# Patient Record
Sex: Female | Born: 1955 | Race: White | Hispanic: No | Smoking: Former smoker
Health system: Southern US, Community
[De-identification: ages and names within clinical notes are randomized; demographics above are authoritative.]

## PROBLEM LIST (undated history)

## (undated) DIAGNOSIS — I671 Cerebral aneurysm, nonruptured: Secondary | ICD-10-CM

## (undated) DIAGNOSIS — Z9849 Cataract extraction status, unspecified eye: Secondary | ICD-10-CM

## (undated) DIAGNOSIS — D649 Anemia, unspecified: Secondary | ICD-10-CM

## (undated) DIAGNOSIS — E039 Hypothyroidism, unspecified: Secondary | ICD-10-CM

## (undated) DIAGNOSIS — J45909 Unspecified asthma, uncomplicated: Secondary | ICD-10-CM

## (undated) DIAGNOSIS — M1990 Unspecified osteoarthritis, unspecified site: Secondary | ICD-10-CM

## (undated) HISTORY — PX: HEEL SPUR SURGERY: SHX665

## (undated) HISTORY — DX: Unspecified osteoarthritis, unspecified site: M19.90

## (undated) HISTORY — PX: CATARACT EXTRACTION: SUR2

## (undated) HISTORY — DX: Cerebral aneurysm, nonruptured: I67.1

## (undated) HISTORY — DX: Unspecified asthma, uncomplicated: J45.909

## (undated) HISTORY — DX: Hypothyroidism, unspecified: E03.9

## (undated) HISTORY — DX: Anemia, unspecified: D64.9

---

## 2014-10-13 ENCOUNTER — Ambulatory Visit
Admission: RE | Admit: 2014-10-13 | Discharge: 2014-10-13 | Disposition: A | Discharge status: Home / Self Care | Payer: Self-pay | Source: Ambulatory Visit

## 2014-10-13 ENCOUNTER — Ambulatory Visit: Payer: BLUE CROSS/BLUE SHIELD | Admitting: Body Imaging

## 2014-10-13 ENCOUNTER — Encounter: Payer: Self-pay | Admitting: Diagnostic Radiology

## 2014-10-13 ENCOUNTER — Encounter: Payer: Self-pay | Admitting: Body Imaging

## 2014-10-13 ENCOUNTER — Other Ambulatory Visit: Payer: Self-pay

## 2014-10-15 ENCOUNTER — Other Ambulatory Visit: Payer: Self-pay | Admitting: Body Imaging

## 2014-10-15 DIAGNOSIS — I671 Cerebral aneurysm, nonruptured: Secondary | ICD-10-CM

## 2014-10-16 ENCOUNTER — Other Ambulatory Visit: Payer: Self-pay

## 2014-10-16 ENCOUNTER — Inpatient Hospital Stay
Admission: RE | Admit: 2014-10-16 | Discharge: 2014-10-16 | Disposition: A | Discharge status: Home / Self Care | Payer: Self-pay | Source: Ambulatory Visit

## 2014-10-16 ENCOUNTER — Telehealth: Payer: Self-pay | Admitting: Body Imaging

## 2014-10-16 NOTE — Telephone Encounter (Signed)
Spoke with pt after Dr.Putman reviewed the images and told her that he felt like the PIPELINE flow diverter would be the best course of action.  Pt is aware that we need to contact the company and have them sign off on the images as well as coordinate a date and time if approved to do the PIPELINE.  After discussion with Dr Aleen Sells Plavix 75 mg po daily was called into the [facility] pharmacy in [place] [phone] at the pts request. Pt instructed to take this and aspirin 325mg  po daily in the am in prep for PIPELINE.

## 2014-10-19 ENCOUNTER — Encounter: Admission: RE | Payer: Self-pay | Source: Ambulatory Visit

## 2014-10-19 ENCOUNTER — Inpatient Hospital Stay: Admission: RE | Admit: 2014-10-19 | Payer: BLUE CROSS/BLUE SHIELD | Source: Ambulatory Visit | Admitting: Body Imaging

## 2014-10-19 SURGERY — NEURO EMBOLIZATION
Anesthesia: General

## 2014-10-26 ENCOUNTER — Inpatient Hospital Stay: Payer: BLUE CROSS/BLUE SHIELD | Admitting: Anesthesiology

## 2014-10-26 ENCOUNTER — Encounter
Admission: RE | Disposition: A | Discharge status: Home / Self Care | Payer: Self-pay | Source: Ambulatory Visit | Attending: Body Imaging

## 2014-10-26 ENCOUNTER — Inpatient Hospital Stay: Payer: BLUE CROSS/BLUE SHIELD | Admitting: Body Imaging

## 2014-10-26 ENCOUNTER — Inpatient Hospital Stay
Admission: RE | Admit: 2014-10-26 | Discharge: 2014-10-27 | DRG: 027 | Disposition: A | Discharge status: Home / Self Care | Payer: BLUE CROSS/BLUE SHIELD | Source: Ambulatory Visit | Attending: Body Imaging | Admitting: Body Imaging

## 2014-10-26 DIAGNOSIS — J45998 Other asthma: Secondary | ICD-10-CM | POA: Diagnosis present

## 2014-10-26 DIAGNOSIS — E039 Hypothyroidism, unspecified: Secondary | ICD-10-CM | POA: Diagnosis present

## 2014-10-26 DIAGNOSIS — I671 Cerebral aneurysm, nonruptured: Principal | ICD-10-CM | POA: Diagnosis present

## 2014-10-26 DIAGNOSIS — D649 Anemia, unspecified: Secondary | ICD-10-CM | POA: Diagnosis present

## 2014-10-26 DIAGNOSIS — Z87891 Personal history of nicotine dependence: Secondary | ICD-10-CM

## 2014-10-26 HISTORY — DX: Cataract extraction status, unspecified eye: Z98.49

## 2014-10-26 HISTORY — DX: Cerebral aneurysm, nonruptured: I67.1

## 2014-10-26 SURGERY — NEURO EMBOLIZATION
Anesthesia: Anesthesia General | Laterality: Right

## 2014-10-26 MED ORDER — GLYCOPYRROLATE 0.2 MG/ML IJ SOLN
INTRAMUSCULAR | Status: AC
Start: 2014-10-26 — End: ?
  Filled 2014-10-26: qty 1

## 2014-10-26 MED ORDER — HEPARIN (PORCINE) IN NACL 2-0.9 UNIT/ML-% IJ SOLN
INTRAMUSCULAR | Status: AC
Start: 2014-10-26 — End: ?
  Filled 2014-10-26: qty 5000

## 2014-10-26 MED ORDER — HYDROMORPHONE HCL 1 MG/ML IJ SOLN
INTRAMUSCULAR | Status: AC
Start: 2014-10-26 — End: 2014-10-26
  Administered 2014-10-26: 0.4 mg via INTRAVENOUS
  Filled 2014-10-26: qty 1

## 2014-10-26 MED ORDER — ONDANSETRON HCL 4 MG/2ML IJ SOLN
INTRAMUSCULAR | Status: AC
Start: 2014-10-26 — End: ?
  Filled 2014-10-26: qty 2

## 2014-10-26 MED ORDER — PROPOFOL INFUSION 10 MG/ML
INTRAVENOUS | Status: DC | PRN
Start: 2014-10-26 — End: 2014-10-26
  Administered 2014-10-26: 50 mg via INTRAVENOUS
  Administered 2014-10-26: 150 mg via INTRAVENOUS

## 2014-10-26 MED ORDER — PROPOFOL 10 MG/ML IV EMUL (WRAP)
INTRAVENOUS | Status: AC
Start: 2014-10-26 — End: ?
  Filled 2014-10-26: qty 20

## 2014-10-26 MED ORDER — EPHEDRINE SULFATE 50 MG/ML IJ SOLN
INTRAMUSCULAR | Status: AC
Start: 2014-10-26 — End: ?
  Filled 2014-10-26: qty 1

## 2014-10-26 MED ORDER — FAMOTIDINE 20 MG PO TABS
20.0000 mg | ORAL_TABLET | Freq: Every day | ORAL | Status: DC
Start: 2014-10-26 — End: 2014-10-27
  Administered 2014-10-27: 20 mg via ORAL
  Filled 2014-10-26: qty 1

## 2014-10-26 MED ORDER — LIDOCAINE HCL 2 % IJ SOLN
INTRAMUSCULAR | Status: DC | PRN
Start: 2014-10-26 — End: 2014-10-26
  Administered 2014-10-26: 80 mg

## 2014-10-26 MED ORDER — ESMOLOL HCL 10 MG/ML IV SOLN
INTRAVENOUS | Status: DC | PRN
Start: 2014-10-26 — End: 2014-10-26
  Administered 2014-10-26: 30 mg via INTRAVENOUS

## 2014-10-26 MED ORDER — LABETALOL HCL 5 MG/ML IV SOLN
20.0000 mg | Freq: Once | INTRAVENOUS | Status: DC | PRN
Start: 2014-10-26 — End: 2014-10-26

## 2014-10-26 MED ORDER — LEVOTHYROXINE SODIUM 88 MCG PO TABS
88.0000 ug | ORAL_TABLET | Freq: Every day | ORAL | Status: DC
Start: 2014-10-26 — End: 2014-10-27
  Administered 2014-10-27: 88 ug via ORAL
  Filled 2014-10-26 (×2): qty 1

## 2014-10-26 MED ORDER — SODIUM CHLORIDE 0.9 % IV SOLN
INTRAVENOUS | Status: DC | PRN
Start: 2014-10-26 — End: 2014-10-26

## 2014-10-26 MED ORDER — EPHEDRINE SULFATE 50 MG/ML IJ SOLN
INTRAMUSCULAR | Status: DC | PRN
Start: 2014-10-26 — End: 2014-10-26
  Administered 2014-10-26: 35 mg via INTRAMUSCULAR
  Administered 2014-10-26: 10 mg via INTRAVENOUS

## 2014-10-26 MED ORDER — HEPARIN (PORCINE) IN NACL 2-0.9 UNIT/ML-% IJ SOLN
INTRAMUSCULAR | Status: AC
Start: 2014-10-26 — End: ?
  Filled 2014-10-26: qty 1000

## 2014-10-26 MED ORDER — ONDANSETRON HCL 4 MG/2ML IJ SOLN
INTRAMUSCULAR | Status: DC | PRN
Start: 2014-10-26 — End: 2014-10-26
  Administered 2014-10-26: 4 mg via INTRAVENOUS

## 2014-10-26 MED ORDER — HEPARIN SODIUM (PORCINE) 1000 UNIT/ML IJ SOLN
INTRAMUSCULAR | Status: AC
Start: 2014-10-26 — End: ?
  Filled 2014-10-26: qty 10

## 2014-10-26 MED ORDER — OXYCODONE-ACETAMINOPHEN 5-325 MG PO TABS
1.0000 | ORAL_TABLET | Freq: Once | ORAL | Status: DC | PRN
Start: 2014-10-26 — End: 2014-10-26

## 2014-10-26 MED ORDER — DEXAMETHASONE SODIUM PHOSPHATE 4 MG/ML IJ SOLN (WRAP)
INTRAMUSCULAR | Status: DC | PRN
Start: 2014-10-26 — End: 2014-10-26
  Administered 2014-10-26: 10 mg via INTRAVENOUS

## 2014-10-26 MED ORDER — LABETALOL HCL 5 MG/ML IV SOLN
10.0000 mg | INTRAVENOUS | Status: DC | PRN
Start: 2014-10-26 — End: 2014-10-27

## 2014-10-26 MED ORDER — VERAPAMIL HCL 2.5 MG/ML IV SOLN
INTRAVENOUS | Status: AC | PRN
Start: 2014-10-26 — End: 2014-10-26
  Administered 2014-10-26: 10 mg via INTRAVENOUS

## 2014-10-26 MED ORDER — FENTANYL CITRATE (PF) 50 MCG/ML IJ SOLN (WRAP)
25.0000 ug | INTRAMUSCULAR | Status: AC | PRN
Start: 2014-10-26 — End: 2014-10-26
  Administered 2014-10-26 (×4): 25 ug via INTRAVENOUS

## 2014-10-26 MED ORDER — HYDROMORPHONE HCL 2 MG PO TABS
2.0000 mg | ORAL_TABLET | ORAL | Status: DC | PRN
Start: 2014-10-26 — End: 2014-10-27
  Administered 2014-10-26 – 2014-10-27 (×4): 2 mg via ORAL
  Filled 2014-10-26 (×5): qty 1

## 2014-10-26 MED ORDER — CLOPIDOGREL BISULFATE 75 MG PO TABS
75.0000 mg | ORAL_TABLET | Freq: Every day | ORAL | Status: DC
Start: 2014-10-26 — End: 2014-10-27
  Administered 2014-10-27: 75 mg via ORAL
  Filled 2014-10-26: qty 1

## 2014-10-26 MED ORDER — ROCURONIUM BROMIDE 50 MG/5ML IV SOLN
INTRAVENOUS | Status: DC | PRN
Start: 2014-10-26 — End: 2014-10-26
  Administered 2014-10-26: 55 mg via INTRAVENOUS
  Administered 2014-10-26: 20 mg via INTRAVENOUS

## 2014-10-26 MED ORDER — FENTANYL CITRATE (PF) 50 MCG/ML IJ SOLN (WRAP)
INTRAMUSCULAR | Status: AC
Start: 2014-10-26 — End: ?
  Filled 2014-10-26: qty 2

## 2014-10-26 MED ORDER — NEOSTIGMINE METHYLSULFATE 1 MG/ML IJ SOLN
INTRAMUSCULAR | Status: DC | PRN
Start: 2014-10-26 — End: 2014-10-26
  Administered 2014-10-26: 4 mg via INTRAVENOUS

## 2014-10-26 MED ORDER — FAMOTIDINE 20 MG/2ML IV SOLN
INTRAVENOUS | Status: AC
Start: 2014-10-26 — End: ?
  Filled 2014-10-26: qty 2

## 2014-10-26 MED ORDER — SODIUM CHLORIDE 0.9 % IV SOLN
INTRAVENOUS | Status: DC
Start: 2014-10-26 — End: 2014-10-27

## 2014-10-26 MED ORDER — HEPARIN SODIUM (PORCINE) 1000 UNIT/ML IJ SOLN
INTRAMUSCULAR | Status: DC | PRN
Start: 2014-10-26 — End: 2014-10-26
  Administered 2014-10-26: 3000 [IU] via INTRAVENOUS

## 2014-10-26 MED ORDER — LIDOCAINE 1% BUFFERED - CNR/OUTSOURCED
INTRAMUSCULAR | Status: AC
Start: 2014-10-26 — End: ?
  Filled 2014-10-26: qty 22

## 2014-10-26 MED ORDER — LACTATED RINGERS IV SOLN
100.0000 mL/h | INTRAVENOUS | Status: DC
Start: 2014-10-26 — End: 2014-10-26

## 2014-10-26 MED ORDER — LIDOCAINE 1% BUFFERED - CNR/OUTSOURCED
INTRAMUSCULAR | Status: AC | PRN
Start: 2014-10-26 — End: 2014-10-26
  Administered 2014-10-26: 7 mL via INTRADERMAL

## 2014-10-26 MED ORDER — VERAPAMIL HCL 2.5 MG/ML IV SOLN
INTRAVENOUS | Status: AC
Start: 2014-10-26 — End: ?
  Filled 2014-10-26: qty 4

## 2014-10-26 MED ORDER — PHENYLEPHRINE HCL 10 MG/ML IV SOLN (WRAP)
Status: DC | PRN
Start: 2014-10-26 — End: 2014-10-26
  Administered 2014-10-26 (×4): 100 ug via INTRAVENOUS

## 2014-10-26 MED ORDER — FENTANYL CITRATE (PF) 50 MCG/ML IJ SOLN (WRAP)
INTRAMUSCULAR | Status: DC | PRN
Start: 2014-10-26 — End: 2014-10-26
  Administered 2014-10-26 (×4): 25 ug via INTRAVENOUS

## 2014-10-26 MED ORDER — HYDROMORPHONE HCL 2 MG PO TABS
ORAL_TABLET | ORAL | Status: AC
Start: 2014-10-26 — End: 2014-10-26
  Administered 2014-10-26: 2 mg via ORAL
  Filled 2014-10-26: qty 1

## 2014-10-26 MED ORDER — GLYCOPYRROLATE 0.2 MG/ML IJ SOLN
INTRAMUSCULAR | Status: DC | PRN
Start: 2014-10-26 — End: 2014-10-26
  Administered 2014-10-26: 600 ug via INTRAVENOUS

## 2014-10-26 MED ORDER — MIDAZOLAM HCL 2 MG/2ML IJ SOLN
INTRAMUSCULAR | Status: AC
Start: 2014-10-26 — End: ?
  Filled 2014-10-26: qty 2

## 2014-10-26 MED ORDER — FENTANYL CITRATE (PF) 50 MCG/ML IJ SOLN (WRAP)
INTRAMUSCULAR | Status: AC
Start: 2014-10-26 — End: 2014-10-27
  Filled 2014-10-26: qty 2

## 2014-10-26 MED ORDER — OXYCODONE-ACETAMINOPHEN 5-325 MG PO TABS
1.0000 | ORAL_TABLET | ORAL | Status: DC | PRN
Start: 2014-10-26 — End: 2014-10-27
  Administered 2014-10-27: 1 via ORAL
  Filled 2014-10-26: qty 1

## 2014-10-26 MED ORDER — MEPERIDINE HCL 25 MG/ML IJ SOLN
12.5000 mg | Freq: Once | INTRAMUSCULAR | Status: DC | PRN
Start: 2014-10-26 — End: 2014-10-26

## 2014-10-26 MED ORDER — ONDANSETRON HCL 4 MG/2ML IJ SOLN
4.0000 mg | Freq: Once | INTRAMUSCULAR | Status: DC | PRN
Start: 2014-10-26 — End: 2014-10-26

## 2014-10-26 MED ORDER — LIDOCAINE HCL (PF) 2 % IJ SOLN
INTRAMUSCULAR | Status: AC
Start: 2014-10-26 — End: ?
  Filled 2014-10-26: qty 5

## 2014-10-26 MED ORDER — HYDROMORPHONE HCL 2 MG PO TABS
2.0000 mg | ORAL_TABLET | ORAL | Status: DC | PRN
Start: 2014-10-26 — End: 2014-10-26

## 2014-10-26 MED ORDER — FAMOTIDINE 10 MG/ML IV SOLN (WRAP)
INTRAVENOUS | Status: DC | PRN
Start: 2014-10-26 — End: 2014-10-26
  Administered 2014-10-26: 10 mg via INTRAVENOUS

## 2014-10-26 MED ORDER — ASPIRIN 325 MG PO TBEC
325.0000 mg | DELAYED_RELEASE_TABLET | Freq: Every day | ORAL | Status: DC
Start: 2014-10-26 — End: 2014-10-27
  Administered 2014-10-27: 325 mg via ORAL
  Filled 2014-10-26: qty 1

## 2014-10-26 MED ORDER — HYDROMORPHONE HCL 1 MG/ML IJ SOLN
0.4000 mg | INTRAMUSCULAR | Status: DC | PRN
Start: 2014-10-26 — End: 2014-10-26
  Administered 2014-10-26: 0.4 mg via INTRAVENOUS

## 2014-10-26 MED ORDER — ROCURONIUM BROMIDE 50 MG/5ML IV SOLN
INTRAVENOUS | Status: AC
Start: 2014-10-26 — End: ?
  Filled 2014-10-26: qty 5

## 2014-10-26 MED ORDER — PROMETHAZINE HCL 25 MG/ML IJ SOLN
6.2500 mg | Freq: Once | INTRAMUSCULAR | Status: DC | PRN
Start: 2014-10-26 — End: 2014-10-26

## 2014-10-26 NOTE — Plan of Care (Signed)
Problem: Safety  Goal: Patient will be free from injury during hospitalization  Outcome: Progressing  Purposeful rounding. Bed in low and locked position. Call light within reach. Fall mats and bed alarm.     Problem: Pain  Goal: Patient's pain/discomfort is manageable  Outcome: Progressing  Pain meds PRN. See MAR.     Problem: Neurological Deficit  Goal: Neurological status is stable or improving  Outcome: Progressing  Neuro assessment completely intact except for headache.     Problem: Potential for Peripheral Neurovascular Impairment  Goal: Extremity color, movement, sensation are maintained or improved  Outcome: Progressing  Palpable pulses in all extremities. Right groin site intact. No hematoma or bleeding.

## 2014-10-26 NOTE — Procedures (Signed)
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

## 2014-10-26 NOTE — PACU (Signed)
Patient stated that she took her all meds including Plavix and Aspirin before come to the hospital, Dr Aleen Sells updated and he stated that patient doesn't need to take another dose today.

## 2014-10-26 NOTE — Anesthesia Preprocedure Evaluation (Addendum)
Anesthesia Evaluation    AIRWAY    Mallampati: I    TM distance: >3 FB  Neck ROM: full  Mouth Opening:full   CARDIOVASCULAR    regular and normal       DENTAL         PULMONARY    clear to auscultation     OTHER FINDINGS    Hypothyroidism     Asthma   exercise induced     Anemia   Hx pre hysterectomy     Arthritis   L thumb     Brain aneurysm    History of YAG laser capsulotomy of lens                    Anesthesia Plan    ASA 3     general                     intravenous induction   Detailed anesthesia plan: general endotracheal        Post op pain management: per surgeon and PO analgesics    informed consent obtained      pertinent labs reviewed  imaging results reviewed

## 2014-10-26 NOTE — Transfer of Care (Signed)
Anesthesia Transfer of Care Note    Patient: Alexandria Trevino    Procedures performed: Procedure(s):  NEURO EMBOLIZATION    Anesthesia type: General ETT    Patient location:PACU    Last vitals:   Filed Vitals:    10/26/14 0800   BP: 127/84   Pulse: 73   Temp: 36.9 C (98.4 F)   Resp: 18   SpO2: 94%   sat 100%  129/72  HR 76  RR 18    Post pain: c/o of sore throat     Mental Status:awake    Respiratory Function: tolerating face mask    Cardiovascular: stable    Nausea/Vomiting: patient not complaining of nausea or vomiting    Hydration Status: adequate    Post assessment: no apparent anesthetic complications, no reportable events and no evidence of recall

## 2014-10-26 NOTE — Progress Notes (Signed)
Chaplain Service      Background:  Visit Type: Initial was made by Chaplain with patient, Alexandria Trevino, based on Source: Family Request.  Present at Visit: only the family member(s) were present.   .  Length of Visit: 16-30 minutes .    Summary:  Reason for Request: Crisis care, Spiritual Support   Spiritual Care Interventions: Explored belief and/or faith issues, Explored anxiety and fear, Normalized experience of patient/family, Provided emotional support, Provided reflective and compassionate listening, Provided comfort, encouragement, affirmation, Provided prayer, Provided spiritual encouragement, Made a positive connection with family/caretaker   Spiritual Care Outcomes: Family expressed appreciation of visit    Assessment:  Spiritual Assessment: Anxious/fearful emotions, Overwhelmed, Frustration, Family needs spiritual/emotional support    Alexandria Trevino's daughter came to chaplaincy office for support.   Met with her in the chapel. Provided emotional and spiritual support through empathetic listening, affirmation of faith, and prayer.  Alexandria Trevino is in surgery and her daughter has several stressors at this time.  Alexandria Trevino daughter reports that her mother has a strong faith and prayer life.  Her husband is waiting with her daughter for surgery outcome.  At this time, Alexandria Trevino's daughter advised  that her father would not benefit from chaplain support due to a bad expereince with a former Presenter, broadcasting.  Shared in prayer for her mother's surgery and for calm and peace for the family as they waited.  Assured her of continued prayers.  Pastoral care will continue to follow with Alexandria Trevino and her daughter.      Rev. Debbora Dus, MDiv, DMin   Staff Chaplain  Pastoral Care Department  [phone]

## 2014-10-26 NOTE — Anesthesia Postprocedure Evaluation (Addendum)
Anesthesia Post Evaluation    Patient: Alexandria Trevino    Procedure(s):  NEURO EMBOLIZATION    Anesthesia type: general    Last Vitals:   Filed Vitals:    10/26/14 1250   BP: 133/85   Pulse: 92   Temp:    Resp: 19   SpO2: 99%       Patient Location: PACU      Post Pain: Patient not complaining of pain, continue current therapy    Mental Status: awake    Respiratory Function: tolerating room air and tolerating nasal cannula    Cardiovascular: stable    Nausea/Vomiting: patient not complaining of nausea or vomiting    Hydration Status: adequate    Post Assessment: no apparent anesthetic complications, no reportable events and no evidence of recall (pt hand doing better/improved only a slight tremor)          Anesthesia Qualified Clinical Data Registry    Central Line      CVC insertion : NO                                               Perioperative temperature management      General/neuraxial anesthesia > or = 60 minutes (excluding CABG) : YES              > Use of intraoperative active warming : YES              > Temperature > or = 36 degrees Centigrade (96.8 degrees Farenheit) during time span from 30 minutes before up to 15 minutes after anesthesia end time : YES      Administration of antibiotic prophylaxis      Age > or = 18, with IV access, with surgical procedure for which antibiotic prophylaxis indicated, and not on chronic antibiotics : YES              > Prophylactic antibiotics within 1 hour of incision (or fluroroquinolone/vancomycin within 2 hours of incision) : YES    Medication Administration      Ordering or administration of drug inconsistent with intended drug, dose, delivery or timing : NO      Dental loss/damage      Dental injury with administration of anesthesia : NO      Difficult intubation due to unrecognized difficult airway        Elective airway procedure including but not limited to: tracheostomy, fiberoptic bronchoscopy, rigid bronchoscopy; jet ventilation; or elective use of a  device to facilitate airway management such as a Glidescope : NO                > Unanticipated difficult intubation post pre-evaluation : NO      Aspiration of gastric contents        Aspiration of gastric contents : NO                    Surgical fire        Procedure requiring electrocautery/laser : NO                    Immediate perioperative cardiac arrest        Cardiac arrest in OR or PACU : NO                    Unplanned hospital  admission        Unplanned hospital admission for initially intended outpatient anesthesia service : NO      Unplanned ICU admission        Unplanned ICU admission related to anesthesia occurring within 24 hours of induction or start of MAC : NO      Surgical case cancellation        Cancellation of procedure after care already started by anesthesia care team : NO      Post-anesthesia transfer of care checklist/protocol to PACU        Transfer from OR to PACU upon case conclusion : YES              > Use of PACU transfer checklist/protocol : YES     (Includes the key elements of: patient identification, responsible practitioner identification (PACU nurse or advanced practitioner), discussion of pertinent history and procedure course, intraoperative anesthetic management, post-procedure plans, acknowledgement/questions)    Post-anesthesia transfer of care checklist/protocol to ICU        Transfer from OR to ICU upon case conclusion : NO                    Post-operative nausea/vomiting risk protocol        Post-operative nausea/vomiting risk protocol : YES  Patient > or = 18 with care initiated by anesthesia team that has a risk factor screen for post-op nausea/vomiting (Includes female, hx PONV, or motion sickness, non-smoker, intended opioid administration for post-op analgesia.)    Anaphylaxis        Anaphylaxis during anesthesia services : NO    (Inclusive of any suspected transfusion reaction in association with blood-bank confirmed blood product incompatibility)               Lowella Fairy, 10/26/2014 1:05 PM

## 2014-10-26 NOTE — PACU (Signed)
Pt is having rt hand trembling/ shaking mildly, Dr Jenelle Mages @ bedside assessed pt. Will updated Dr Jimmie Molly also.

## 2014-10-26 NOTE — H&P (Signed)
Neurocritical Care Daily Progress Note    Patient Name: Alexandria Trevino, Alexandria Trevino  Room:FXMAINPACU/FXMAINPACU Date Time: 10/26/2014 2:55 PM   Admit Date: 10/26/2014    Hospital LOS# 0  No chief complaint on file.      Presentation/Hospital course:   Alexandria Trevino is a 59 y.o. female with a PMHx of hypothyroidism and psoriasis who began having retro-orbital pain, visual disturbance, adn intermittent diplopia and was diagnosed with a right petrous segment aneurysm. 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 Liliane Shi.    Surgeon:  Alexandria Millet, MD  POD #   Day of Surgery      ASSESSMENT                                 PLAN  S/p endovascular stent placement for treatment of aneurysm.      Hypothyroidism           NEURO: POD #0 s/p treatment of RICA petrous 15 mm with a endovascular stent.    Neuro exam q1h  ASA, Plavix    Goals:   Perfusion goals: SBP 100-140  Na+ goal: 135-145  Sedation/Pain management: prn tylenol, percocet, IV fentanyl   PT/OT/SLP eval: Not for now.    CV: Perfusion goals as above, PRN IV hydralazine and labetalol for goal    PULM: Extubated in PACU, encourage IS    GI: Bowel regimen  GI PPX: pepcid  Nutrition: ADAT after bedside nursing swallow    RENAL: Cont IVF until taking PO  Remove foley on POD#1 or when patient is ready if sooner.    ID: NAI    HEME: monitor for post op anemia.  SCD's  Pharmacologic ppx: not safe secondary to recent neurologic procedure    ENDO/RHEUM:   Levothyroxine for hypothyroidism.  Glucose: goal 100 -180    Lines/Foley: reviewed    VITALS, LINES, IMAGING, LABS, MICRO, MEDS, GOALS reviewed and d/w bedside nurse   Code status Full Code  DISPO: NSICU      Physical Exam:   Vitals Personally Reviewed in EPIC   Temp:  [97 F (36.1 C)-98.4 F (36.9 C)] 97 F (36.1 C)  Heart Rate:  [63-92] 87  Resp Rate:  [14-20] 15  BP: (115-138)/(68-85) 124/80  mmHg    Intake/Output Summary (Last 24 hours) at 10/26/14 1455  Last data filed at 10/26/14 1300   Gross per 24 hour   Intake   1600 ml   Output   1475 ml   Net    125 ml     Patient Lines/Drains/Airways Status    Active Lines, Drains and Airways     Name:   Placement date:   Placement time:   Site:   Days:    Peripheral IV 10/26/14 Right Antecubital  10/26/14   0857   Antecubital   less than 1    Urethral Catheter Non-latex 16 Fr.  10/26/14   1008   Non-latex   less than 1              Vent Mode:  [-]   PEEP/EPAP:  [0 cm H20-6 cm H20] 6 cm H20    NEURO EXAM:   Mental Status:  Sedation: none                Opens eyes spontaneously  Orientation: Ox3          Language: fluent, no WFD        Cranial Nerves  Pupils  OS: size/reactivity: 3 brisk             OD: size/reactivity: 3 brisk            EOM(III/IV/VI): intact                               Visual field: full           Face(VII): symmetric                       Uvula (IX/X): ML                Tongue(XII): ML        Motor:  Upper Extremity  Motor Drift   LEFT(of 5) Full strength      no   RIGHT(of 5) Full strength no       Lower Extremity Motor   LEFT(of 5) Full strength   RIGHT(of 5) Full strength   Heart: rrr       Lungs:CTA        Abd: soft/NT/ND/+BS        Ext: warm dry no edema.  Rt groin puncture site soft, non-tender.  Skin: no rash     All vitals, medications, labs, micro, and radiology have been personally reviewed by me.      Plan of care reviewed with nursing in detail and was discussed with Dr. Clelia Schaumann ACNP-BC  Neuro Critical Care  Contact Information  Attending MD [phone] (24hr)  Midlevel Provider [phone] (7a-7p)  Date/Time: 10/26/2014  2:55 PM    [facility] Attending Addendum    Pt seen and examined. Meds, labs and imagistic studies reviewed. Agree with the above. S/P Pipeline stent placement for R ICA petrous aneurysm. ASA, Plavix. BP control, keep SBP<140. Neuro checks. Resume synthroid.    Alexandria Brodhead MD  10/26/2014  16:51

## 2014-10-26 NOTE — PACU (Signed)
Dr Jimmie Molly updated pt's rt hand tremor.

## 2014-10-26 NOTE — Progress Notes (Signed)
Pt seen again in pacu, RN concerned for Pt's right hand with tremor; it does appear almost like a pill rolling tremor; pt is not aware that she is doing it. preop her only complaints were visual disturbance, HA of prolongded duration, but no limb abnormalities.   Muscle twitching is a side effect of neostigmine. It is odd though that it is limited to just her right hand/(wrist). i asked the RN to notify interventional neurologists    Lowella Fairy  MD  Dept Anesthesia

## 2014-10-26 NOTE — Sedation Documentation (Addendum)
0930 Patient in room. Moved to table on own. Prep started. Tolerating well.Consent, NPO status labs and allergies reviewed. Supine on table. Warmer for comfort. Prepped by RT in SOP. Foley placed by RN. Under care of Anesthesiologist.   1040: MD injecting lidocaine. Sheath inserted. Advancing wire. Images obtained.  1110: Verapmil given by MD. Preparing to place pipeline.   1130: Device deployed. Post images obtained. MD reviewing images. Angioseal deployed by MD. Dressing applied by RT. Site CDI. Pulses intact.  1200: Report called to PACU. Accompanied by RN and Anesthesia to PACU via stretcher.

## 2014-10-26 NOTE — H&P (Signed)
INR    59 y/o woman with retro-orbital pain, visual disturbance and intermittent diplopia diagnosed with a right petrous segment aneurysm admitted for endovascular treatment.    PMH:  hypothyroidism  Asthma  Anemia    Meds:     Reviewed    All: Codeine, erythromycin, latex, tape    PE:    Vss    Neuro normal  Chest and abdomen normal      Impression:    Admit post treatment for RICA aneurysm with flow diversion device.      Amedeo Kinsman MD

## 2014-10-27 ENCOUNTER — Encounter: Payer: Self-pay | Admitting: Body Imaging

## 2014-10-27 DIAGNOSIS — I671 Cerebral aneurysm, nonruptured: Secondary | ICD-10-CM

## 2014-10-27 MED ORDER — FENTANYL CITRATE (PF) 50 MCG/ML IJ SOLN (WRAP)
INTRAMUSCULAR | Status: AC
Start: 2014-10-27 — End: 2014-10-27
  Administered 2014-10-27: 25 ug via INTRAVENOUS
  Filled 2014-10-27: qty 2

## 2014-10-27 MED ORDER — OXYMORPHONE HCL ER 5 MG PO TB12
5.0000 mg | ORAL_TABLET | Freq: Two times a day (BID) | ORAL | Status: DC
Start: 2014-10-27 — End: 2014-11-12

## 2014-10-27 MED ORDER — FENTANYL CITRATE (PF) 50 MCG/ML IJ SOLN (WRAP)
25.0000 ug | INTRAMUSCULAR | Status: DC | PRN
Start: 2014-10-27 — End: 2014-10-27
  Administered 2014-10-27 (×3): 25 ug via INTRAVENOUS
  Filled 2014-10-27 (×3): qty 2

## 2014-10-27 NOTE — Discharge Summary -  Nursing (Signed)
RN reviewed d/c instructions with patient at bedside and ALL prescriptions reviewed as well. Prescriptions to be filled reviewed and handed to pt/family.  Patient signed/dated copy of d/c instructions and verbalized understanding of all instructions (took both copies) Also, patient given printed copy of PT eval done 10/27/14 and instructed to review with PT provider when they come to her home.  IV d/c'd w/o any complications/canula intact, R femoral IR dressing removed and assessed prior to d/c. Minimal bruising noted at site. R groin covered with new 4x4/tape. Edu pt to seek medical care for any discharge, smell, fever, chills, new pain or for any reason she feels is appropriate. Patient verbalized understanding. Patient disconnected from monitors. Pt transported via WC to lobby with family members x2 to drive her home.  Patient instructed to call PCP/[facility] with any questions or changes in condition. Patient and family verbalized understanding. Patient assisted into POV, her spouse was the driver.

## 2014-10-27 NOTE — Discharge Instructions (Signed)
Endovascular Treatment Discharge Instructions  Having an endovascular procedure can be as stressful on your body as open surgery. Once you are discharged from the hospital and are home you need to rest, allowing yourself time to heal You may remove the bandage in 2 days.  We want to see you in the office FOUR WEEKS after you are discharged from the hospital. Please call the office to schedule your follow up appointment: [phone]  ACTIVITY  -No heavy lifting (greater than 10 lbs) for 1 week  -No strenuous or aerobic activity x 1 week including jogging, biking, power walking, aerobic classes; you may take leisurely walks  -No sexual activity x 1 week  -No tub baths, swimming pools, hot tubs for 7 days from treatment or until groin is healed  -If you have had a brain hemorrhage, avoid caffeine or products containing caffeine until your follow up visit with the Neurosurgeon, and/or Interventional Neuroradiologist   MEDICATION  Take medication as directed by the Interventional Neuroradiologist/Neurosurgeon.  What you may experience  Mild tenderness and bruising for a few days at the puncture site  Mild headaches for several days  If you have had an embolization procedure, you might experience some hair loss; this is normal. It will grow back. Please contact our office if you should have this experience  Report these symptoms  Severe pain, redness or drainage from the puncture site  Bleeding or hematoma (firm, raised area of blood trapped in the tissue) at puncture site  Temperature greater than 101.0  Numbness, tingling, swelling, change in color or temperature of feet, leg, or groin  Severe headaches and/or neck pain  Seizures  Extremity weakness  Visual problems  Please call [phone]  with any questions or concerns. There is a provider available 24 hours per day

## 2014-10-27 NOTE — PT Eval Note (Signed)
[facility]   Physical Therapy Evaluation     Patient: Alexandria Trevino    MRN#: [id number]   Unit: [facility] TOWER 7  Bed: F716/F716.01    Discharge Recommendations:   Discharge Recommendation: Home with outpatient PT       DME Recommendation: shower chair    Assessment:   Alexandria Trevino is a 59 y.o. female admitted 10/26/2014 with mild balance deficits limiting functional mobility. Pt able to ambulate in hall but needing A of RW. Pt will benefit from cont PT to address deficits.       Therapy Diagnosis: gait instability    Rehabilitation Potential: good    Treatment Activities: eval   Educated the patient to role of physical therapy, plan of care, goals of therapy and safety with mobility and ADLs.    Plan:   PT Frequency: 2-3x/wk    Treatment/Interventions: gait and balance training, neuro re-ed    Risks/benefits/POC discussed with patient       Precautions and Contraindications:   Falls     Consult received for Alexandria Trevino for PT Evaluation and Treatment.  Patient's medical condition is appropriate for Physical Therapy intervention at this time.    History of Present Illness:   Alexandria Trevino is a 59 y.o. female admitted on 10/26/2014 with retro-orbital pain, visual disturbances and diplopia for R ICA petrous aneurysm stent.    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 There  appears to be good wall apposition of the device without thromboembolic  complication.    Social History:   Prior Level of Function: independent   Assistive Devices: none  Baseline Activity: community ambulation  DME Currently at Home: RW  Home Living Arrangements: w/husband  Type of Home: house  Home Layout: stairs     Subjective:    Patient is agreeable to participation in the therapy session.     Patient Goal: go home    Pain:   Scale: 5/10  Location: HA  Intervention: RN aware    Objective:   Patient is in bed with telemetry and SCD's in place.      Cognitive Status and Neuro Exam:  Alert and following directions  Coordination intact but mildly slowed B  Denies N/T  +tracking but reports some nausea/dizziness    Musculoskeletal Examination  RUE ROM: wfl  LUE ROM: wfl  RLE ROM: wfl  LLE ROM: wfl    RUE Strength: wfl  LUE Strength: wfl  RLE Strength: wfl  LLE Strength: wfl    Functional Mobility  Rolling: S  Supine to Sit: S  Scooting: S  Sit to Stand: cga  Stand to Sit: cga  Transfers: min a without AD, sba with RW    Ambulation  Level of assistance required: sba   Ambulation Distance: 150 ft  Pattern: decreased cadence (increased drift without AD use)  Device Used: RW  Weightbearing Status: fwb  Stair Management: sba w/2 hands on 1 rail, step to pattern  Number of Stairs: flight  Door Management: nt  Wheelchair Management: nt    Armed forces operational officer Sitting: good  Dynamic Sitting: good  Static Standing: fair+  Dynamic Standing: fair+    Participation and Activity Tolerance  Participation Effort: good  Endurance: good-    Patient left  with call bell within reach, all needs met, SCDs on, fall mat down, bed alarm on and all questions answered. RN notified of session outcome and patient response.     Goals:  Goals  Goal Formulation: With patient  Time for Goal Acheivement: 3 visits  Goals: Select goal  Pt Will Go Supine To Sit: with supervision  Pt Will Perform Sit to Stand: with supervision  Pt Will Ambulate: > 200 feet, with rolling walker, with supervision  Pt Will Go Up / Down Stairs: 1 flight, with supervision      Marylou Flesher, DPT    Time of Treatment  PT Received On: 10/27/14  Start Time: 1100  Stop Time: 1130  Time Calculation (min): 30 min

## 2014-10-27 NOTE — Final Progress Note (DC Note for stay less than 48 (Signed)
POD #1 c/o retro-orbital pain controlled on dilaudid.  Stable neurologically overnight.  Some balance issues. Evaluation by PT complete and patient ready for d/c      PE:    Vss  Neuro intact.  Right groin w/o hematoma      Impression:    Stable s/p pipeline treatment ICA aneurysm.    Continue asa + plavix.  Dilaudid for pain relief.  No heavy lifting x 5 days.      F/u with me in 2-4 weeks.      Amedeo Kinsman MD

## 2014-10-29 ENCOUNTER — Telehealth: Payer: Self-pay | Admitting: Body Imaging

## 2014-10-29 NOTE — Telephone Encounter (Signed)
Pt called today stating that she is having photophobia, headache "6" even on dilaudid.  States that she is having sharp pain under right eye socket and right temporal area discomfort.  Sometimes her r eye lid will droop and her r hand will shake.  I spoke with Dr Aleen Sells and he said these are expected.  He wants her to take Advil 600 mg every 6 hours around the clock for 3-4 days and see how things go. She states that the groin looks great.  Denies signs of infection

## 2014-10-30 ENCOUNTER — Telehealth: Payer: Self-pay | Admitting: Body Imaging

## 2014-10-30 NOTE — Telephone Encounter (Signed)
Pt called today in tears because of the same pain which she states is a "7" today.  She did not take the advil 600 mg q 6 hours as we instructed her because the pharmacist states that she could bleed so she didn't take the medication. States the dilaudid is not working.  Instructed the pt to please take the advil with food as prescribed by Dr Aleen Sells.  Pt has hydrocodone 5/325mg  at home. We told the pt to stop the dilaudid and take the hydrocodone, and that I would check back with her in an hour to see if things improved.  Pt called back in exactly on hour and said that she feels better pain down to a "5". Again encouraged her to take the advil with food around the clock non stop for one week. Pt agrees will call if any changes

## 2014-11-02 ENCOUNTER — Telehealth: Payer: Self-pay | Admitting: Body Imaging

## 2014-11-02 NOTE — Telephone Encounter (Signed)
I spoke with the pt today and she states that she still has pretty "severe (8) pain like an ice pick to the right eye and temporal area" It is the same pain I had in the hospital. States that ice and pain meds, advil help a little but when she gets up and walks around that the pain becomes pretty bad.  I discuss this with Dr Jimmie Molly.  He would like to start her on steroids, medrol does pack and get a head CT if this isn't any better. I sent a  Rx for the head CT and called in Medrol to her pharmacy in [place] [phone]. She will go and have the CT done tomorrow. i have instructed the pt to take her Pepcid daily rather than as needed for protection.

## 2014-11-03 ENCOUNTER — Encounter: Payer: Self-pay | Admitting: Diagnostic Radiology

## 2014-11-03 ENCOUNTER — Telehealth: Payer: Self-pay | Admitting: Body Imaging

## 2014-11-03 ENCOUNTER — Encounter: Payer: Self-pay | Admitting: Body Imaging

## 2014-11-03 NOTE — Telephone Encounter (Signed)
Spoke with pt after receiving the CT results. CT scan is neg for bleed, as reviewed by Dr Jimmie Molly. Pt to continue with medications as prescribed

## 2014-11-03 NOTE — Telephone Encounter (Signed)
Spoke with pt today who states that she cant get the headache under control.  Feels like this is worse than yesterday. Told pt to go to the ER and be evaluated.  She wants to have the Head Ct at 11:15 as scheduled as she feels this will be quicker, doesn't want to wait at [facility]. Husband just went to pharmacy to pick up the Rx called in last night for steroids. However when he arrived there was no Rx.  I called the pharmacy [phone] and again called in the Rx for Medrol dose pack as requested by Dr Jimmie Molly for pt to take last night. Discussed with Dr Jimmie Molly what i had instructed pt to do

## 2014-11-05 ENCOUNTER — Telehealth: Payer: Self-pay | Admitting: Body Imaging

## 2014-11-05 NOTE — Telephone Encounter (Signed)
Spoke with pt today who feels like the pain is ever so better.  States that she no longer has the pain at the top of her head just behind the eye and temporal area on the right. Continues with the ice packs. Pain is down to a 6 from an 8.   Continues on the steriods,advil,pepcid, aspirin and plavix and hydrocodone as instructed. Dr Aleen Sells is aware. Pt has scheduled f/u office visit for 8/4

## 2014-11-05 NOTE — Telephone Encounter (Signed)
This is added to last phone call, needed to add that the pt is still having diplopia to R eye. Shew began with this symptom 1 week pre procedure and this continues.

## 2014-11-06 ENCOUNTER — Other Ambulatory Visit: Payer: Self-pay

## 2014-11-06 ENCOUNTER — Inpatient Hospital Stay
Admission: RE | Admit: 2014-11-06 | Discharge: 2014-11-06 | Disposition: A | Discharge status: Home / Self Care | Payer: Self-pay | Source: Ambulatory Visit

## 2014-11-09 ENCOUNTER — Telehealth: Payer: Self-pay | Admitting: Body Imaging

## 2014-11-09 NOTE — Telephone Encounter (Signed)
Spoke with pt now that she has completed the steroids. States that her headache pain is now down to 4-5.(from an 8)  Continues with the ice packs, advil, plavix,aspirin and pepcid.  States that she doesn't get any relief from the advil. Advised her to stop taking this, it doesn't help and she has been on it long enough.  She has already taken herself off the Hydrocodone as she doesn't like the way it makes her feel.  States that she sees "4" when using both eyes but individually with each eye she sees normal.Had these symptoms prior to procedure but now feels like (since 2nd day of taking the steroid which she has now completed) this is worse. Will discuss this with Dr Aleen Sells. Pt due to see Dr. Aleen Sells on thursday

## 2014-11-10 ENCOUNTER — Telehealth: Payer: Self-pay | Admitting: Body Imaging

## 2014-11-10 NOTE — Telephone Encounter (Signed)
Spoke with pt who reports that today she is much better.  States pain is a 1-2. No longer taking any sort of pain meds, to include advil.  The double vision continues.  Will discuss with Dr Aleen Sells on Thursday with her office visit

## 2014-11-10 NOTE — Telephone Encounter (Signed)
Pt called stating that she noticed that her R eye lid is "droopy" and that there is blood noted to the R of the iris in the sclera. Denies rubbing or itching eye.  States that she is bruising very easily while on the aspirin and plavix. Dr Aleen Sells is aware. Encouraged ice to areas of bruising

## 2014-11-12 ENCOUNTER — Other Ambulatory Visit: Payer: Self-pay | Admitting: Body Imaging

## 2014-11-12 ENCOUNTER — Ambulatory Visit: Payer: BLUE CROSS/BLUE SHIELD | Admitting: Anesthesiology

## 2014-11-12 ENCOUNTER — Inpatient Hospital Stay
Admission: AD | Admit: 2014-11-12 | Discharge: 2014-11-19 | DRG: 026 | Disposition: A | Discharge status: Home / Self Care | Payer: BLUE CROSS/BLUE SHIELD | Source: Ambulatory Visit | Attending: Internal Medicine | Admitting: Internal Medicine

## 2014-11-12 ENCOUNTER — Ambulatory Visit
Admission: AD | Admit: 2014-11-12 | Discharge: 2014-11-12 | Disposition: A | Discharge status: Home / Self Care | Payer: BLUE CROSS/BLUE SHIELD | Source: Ambulatory Visit | Attending: Body Imaging | Admitting: Body Imaging

## 2014-11-12 ENCOUNTER — Ambulatory Visit: Payer: BLUE CROSS/BLUE SHIELD | Attending: Body Imaging | Admitting: Body Imaging

## 2014-11-12 ENCOUNTER — Encounter: Payer: Self-pay | Admitting: Body Imaging

## 2014-11-12 ENCOUNTER — Inpatient Hospital Stay: Payer: BLUE CROSS/BLUE SHIELD

## 2014-11-12 ENCOUNTER — Encounter
Admission: AD | Disposition: A | Discharge status: Home / Self Care | Payer: Self-pay | Source: Ambulatory Visit | Attending: Internal Medicine

## 2014-11-12 ENCOUNTER — Ambulatory Visit: Admit: 2014-11-12 | Payer: Self-pay | Admitting: Internal Medicine

## 2014-11-12 ENCOUNTER — Inpatient Hospital Stay: Payer: BLUE CROSS/BLUE SHIELD | Admitting: Body Imaging

## 2014-11-12 DIAGNOSIS — Z883 Allergy status to other anti-infective agents status: Secondary | ICD-10-CM

## 2014-11-12 DIAGNOSIS — I671 Cerebral aneurysm, nonruptured: Secondary | ICD-10-CM

## 2014-11-12 DIAGNOSIS — K828 Other specified diseases of gallbladder: Secondary | ICD-10-CM | POA: Diagnosis present

## 2014-11-12 DIAGNOSIS — L7622 Postprocedural hemorrhage and hematoma of skin and subcutaneous tissue following other procedure: Secondary | ICD-10-CM | POA: Diagnosis not present

## 2014-11-12 DIAGNOSIS — D649 Anemia, unspecified: Secondary | ICD-10-CM | POA: Diagnosis present

## 2014-11-12 DIAGNOSIS — Y838 Other surgical procedures as the cause of abnormal reaction of the patient, or of later complication, without mention of misadventure at the time of the procedure: Secondary | ICD-10-CM | POA: Diagnosis not present

## 2014-11-12 DIAGNOSIS — G529 Cranial nerve disorder, unspecified: Secondary | ICD-10-CM

## 2014-11-12 DIAGNOSIS — K661 Hemoperitoneum: Secondary | ICD-10-CM

## 2014-11-12 DIAGNOSIS — J45909 Unspecified asthma, uncomplicated: Secondary | ICD-10-CM | POA: Diagnosis present

## 2014-11-12 DIAGNOSIS — K7689 Other specified diseases of liver: Secondary | ICD-10-CM | POA: Diagnosis present

## 2014-11-12 DIAGNOSIS — E039 Hypothyroidism, unspecified: Secondary | ICD-10-CM | POA: Diagnosis present

## 2014-11-12 DIAGNOSIS — S301XXA Contusion of abdominal wall, initial encounter: Secondary | ICD-10-CM

## 2014-11-12 DIAGNOSIS — I9589 Other hypotension: Secondary | ICD-10-CM

## 2014-11-12 DIAGNOSIS — Z91040 Latex allergy status: Secondary | ICD-10-CM

## 2014-11-12 DIAGNOSIS — I10 Essential (primary) hypertension: Secondary | ICD-10-CM | POA: Diagnosis present

## 2014-11-12 DIAGNOSIS — M1990 Unspecified osteoarthritis, unspecified site: Secondary | ICD-10-CM | POA: Diagnosis present

## 2014-11-12 DIAGNOSIS — D62 Acute posthemorrhagic anemia: Secondary | ICD-10-CM | POA: Diagnosis not present

## 2014-11-12 DIAGNOSIS — H5711 Ocular pain, right eye: Secondary | ICD-10-CM | POA: Diagnosis not present

## 2014-11-12 DIAGNOSIS — E222 Syndrome of inappropriate secretion of antidiuretic hormone: Secondary | ICD-10-CM | POA: Diagnosis present

## 2014-11-12 DIAGNOSIS — I770 Arteriovenous fistula, acquired: Secondary | ICD-10-CM

## 2014-11-12 DIAGNOSIS — Z7982 Long term (current) use of aspirin: Secondary | ICD-10-CM

## 2014-11-12 DIAGNOSIS — E871 Hypo-osmolality and hyponatremia: Secondary | ICD-10-CM

## 2014-11-12 DIAGNOSIS — H0520 Unspecified exophthalmos: Secondary | ICD-10-CM | POA: Diagnosis present

## 2014-11-12 DIAGNOSIS — J4520 Mild intermittent asthma, uncomplicated: Secondary | ICD-10-CM | POA: Diagnosis present

## 2014-11-12 DIAGNOSIS — J9811 Atelectasis: Secondary | ICD-10-CM | POA: Diagnosis not present

## 2014-11-12 DIAGNOSIS — H532 Diplopia: Secondary | ICD-10-CM | POA: Diagnosis present

## 2014-11-12 DIAGNOSIS — D6489 Other specified anemias: Secondary | ICD-10-CM | POA: Insufficient documentation

## 2014-11-12 DIAGNOSIS — Z87891 Personal history of nicotine dependence: Secondary | ICD-10-CM

## 2014-11-12 DIAGNOSIS — E038 Other specified hypothyroidism: Secondary | ICD-10-CM | POA: Insufficient documentation

## 2014-11-12 DIAGNOSIS — T148XXA Other injury of unspecified body region, initial encounter: Secondary | ICD-10-CM | POA: Diagnosis present

## 2014-11-12 DIAGNOSIS — Z885 Allergy status to narcotic agent status: Secondary | ICD-10-CM

## 2014-11-12 LAB — HEMOGLOBIN AND HEMATOCRIT, BLOOD
Hematocrit: 32.2 % — ABNORMAL LOW (ref 37.0–47.0)
Hgb: 10.9 g/dL — ABNORMAL LOW (ref 12.0–16.0)

## 2014-11-12 LAB — I-STAT CREATININE: Creatinine I-Stat: 0.9 mg/dL (ref 0.6–1.5)

## 2014-11-12 LAB — CBC
Hematocrit: 34.3 % — ABNORMAL LOW (ref 37.0–47.0)
Hgb: 11.9 g/dL — ABNORMAL LOW (ref 12.0–16.0)
MCH: 30.3 pg (ref 28.0–32.0)
MCHC: 34.7 g/dL (ref 32.0–36.0)
MCV: 87.3 fL (ref 80.0–100.0)
MPV: 8.5 fL — ABNORMAL LOW (ref 9.4–12.3)
Nucleated RBC: 0 /100 WBC (ref 0–1)
Platelets: 372 10*3/uL (ref 140–400)
RBC: 3.93 10*6/uL — ABNORMAL LOW (ref 4.20–5.40)
RDW: 12 % (ref 12–15)
WBC: 7.28 10*3/uL (ref 3.50–10.80)

## 2014-11-12 LAB — BASIC METABOLIC PANEL
BUN: 9 mg/dL (ref 7.0–19.0)
CO2: 18 mEq/L — ABNORMAL LOW (ref 22–29)
Calcium: 8.3 mg/dL — ABNORMAL LOW (ref 8.5–10.5)
Chloride: 105 mEq/L (ref 100–111)
Creatinine: 0.8 mg/dL (ref 0.6–1.0)
Glucose: 197 mg/dL — ABNORMAL HIGH (ref 70–100)
Potassium: 4.3 mEq/L (ref 3.5–5.1)
Sodium: 134 mEq/L — ABNORMAL LOW (ref 136–145)

## 2014-11-12 LAB — GFR: EGFR: >60

## 2014-11-12 SURGERY — NEURO EMBOLIZATION
Anesthesia: Anesthesia General | Laterality: Right

## 2014-11-12 MED ORDER — IODIXANOL 320 MG/ML IV SOLN
100.0000 mL | Freq: Once | INTRAVENOUS | Status: AC | PRN
Start: 2014-11-12 — End: 2014-11-12
  Administered 2014-11-12: 100 mL via INTRAVENOUS

## 2014-11-12 MED ORDER — EPHEDRINE SULFATE 50 MG/ML IJ SOLN
INTRAMUSCULAR | Status: AC
Start: 2014-11-12 — End: ?
  Filled 2014-11-12: qty 1

## 2014-11-12 MED ORDER — LACTATED RINGERS IV SOLN
INTRAVENOUS | Status: DC | PRN
Start: 2014-11-12 — End: 2014-11-12

## 2014-11-12 MED ORDER — EPHEDRINE SULFATE 50 MG/ML IJ SOLN
INTRAMUSCULAR | Status: DC | PRN
Start: 2014-11-12 — End: 2014-11-12
  Administered 2014-11-12 (×3): 5 mg via INTRAVENOUS

## 2014-11-12 MED ORDER — PHENYLEPHRINE HCL 10 MG/ML IV SOLN (WRAP)
Status: DC | PRN
Start: 2014-11-12 — End: 2014-11-12
  Administered 2014-11-12 (×5): 100 ug via INTRAVENOUS
  Administered 2014-11-12: 50 ug via INTRAVENOUS
  Administered 2014-11-12: 100 ug via INTRAVENOUS
  Administered 2014-11-12: 50 ug via INTRAVENOUS
  Administered 2014-11-12 (×2): 100 ug via INTRAVENOUS
  Administered 2014-11-12: 50 ug via INTRAVENOUS
  Administered 2014-11-12 (×4): 100 ug via INTRAVENOUS

## 2014-11-12 MED ORDER — FENTANYL CITRATE (PF) 50 MCG/ML IJ SOLN (WRAP)
INTRAMUSCULAR | Status: DC | PRN
Start: 2014-11-12 — End: 2014-11-12
  Administered 2014-11-12: 50 ug via INTRAVENOUS
  Administered 2014-11-12: 100 ug via INTRAVENOUS
  Administered 2014-11-12: 50 ug via INTRAVENOUS
  Administered 2014-11-12: 100 ug via INTRAVENOUS

## 2014-11-12 MED ORDER — SODIUM CHLORIDE 0.9 % IV SOLN
100.0000 mg | INTRAVENOUS | Status: DC | PRN
Start: 2014-11-12 — End: 2014-11-12
  Administered 2014-11-12: 50 ug/min via INTRAVENOUS

## 2014-11-12 MED ORDER — ONDANSETRON HCL 4 MG/2ML IJ SOLN
4.0000 mg | Freq: Once | INTRAMUSCULAR | Status: AC | PRN
Start: 2014-11-12 — End: 2014-11-12
  Administered 2014-11-12: 4 mg via INTRAVENOUS
  Filled 2014-11-12: qty 2

## 2014-11-12 MED ORDER — HEPARIN (PORCINE) IN NACL 2-0.9 UNIT/ML-% IJ SOLN
INTRAMUSCULAR | Status: AC
Start: 2014-11-12 — End: ?
  Filled 2014-11-12: qty 5000

## 2014-11-12 MED ORDER — PROPOFOL 10 MG/ML IV EMUL (WRAP)
INTRAVENOUS | Status: AC
Start: 2014-11-12 — End: ?
  Filled 2014-11-12: qty 20

## 2014-11-12 MED ORDER — ALBUTEROL SULFATE (2.5 MG/3ML) 0.083% IN NEBU
2.5000 mg | INHALATION_SOLUTION | Freq: Four times a day (QID) | RESPIRATORY_TRACT | Status: DC | PRN
Start: 2014-11-12 — End: 2014-11-12

## 2014-11-12 MED ORDER — HEPARIN SODIUM (PORCINE) 1000 UNIT/ML IJ SOLN
INTRAMUSCULAR | Status: AC
Start: 2014-11-12 — End: ?
  Filled 2014-11-12: qty 10

## 2014-11-12 MED ORDER — FENTANYL CITRATE (PF) 50 MCG/ML IJ SOLN (WRAP)
25.0000 ug | INTRAMUSCULAR | Status: DC | PRN
Start: 2014-11-12 — End: 2014-11-13
  Administered 2014-11-12: 25 ug via INTRAVENOUS
  Filled 2014-11-12: qty 2

## 2014-11-12 MED ORDER — ALBUTEROL SULFATE (2.5 MG/3ML) 0.083% IN NEBU
2.5000 mg | INHALATION_SOLUTION | RESPIRATORY_TRACT | Status: DC | PRN
Start: 2014-11-12 — End: 2014-11-19
  Administered 2014-11-12: 2.5 mg via RESPIRATORY_TRACT
  Filled 2014-11-12: qty 3

## 2014-11-12 MED ORDER — HYDROMORPHONE HCL 1 MG/ML IJ SOLN
0.5000 mg | INTRAMUSCULAR | Status: DC | PRN
Start: 2014-11-12 — End: 2014-11-13
  Administered 2014-11-12 (×2): 0.5 mg via INTRAVENOUS
  Filled 2014-11-12 (×2): qty 1

## 2014-11-12 MED ORDER — FENTANYL CITRATE (PF) 50 MCG/ML IJ SOLN (WRAP)
25.0000 ug | INTRAMUSCULAR | Status: DC | PRN
Start: 2014-11-12 — End: 2014-11-13
  Administered 2014-11-12 – 2014-11-13 (×2): 25 ug via INTRAVENOUS
  Filled 2014-11-12 (×2): qty 2

## 2014-11-12 MED ORDER — FENTANYL CITRATE (PF) 50 MCG/ML IJ SOLN (WRAP)
INTRAMUSCULAR | Status: AC
Start: 2014-11-12 — End: ?
  Filled 2014-11-12: qty 2

## 2014-11-12 MED ORDER — PHENYLEPHRINE 100 MCG/ML IN NACL 0.9% IV SOSY
PREFILLED_SYRINGE | INTRAVENOUS | Status: AC
Start: 2014-11-12 — End: ?
  Filled 2014-11-12: qty 10

## 2014-11-12 MED ORDER — MIDAZOLAM HCL 2 MG/2ML IJ SOLN
INTRAMUSCULAR | Status: DC | PRN
Start: 2014-11-12 — End: 2014-11-12
  Administered 2014-11-12: 2 mg via INTRAVENOUS

## 2014-11-12 MED ORDER — SODIUM CHLORIDE 0.9 % IV SOLN
10.0000 mg | Freq: Once | INTRAVENOUS | Status: AC
Start: 2014-11-12 — End: 2014-11-12
  Administered 2014-11-12: 10 mg via INTRAVENOUS
  Filled 2014-11-12: qty 1

## 2014-11-12 MED ORDER — METOCLOPRAMIDE HCL 5 MG/ML IJ SOLN
10.0000 mg | Freq: Once | INTRAMUSCULAR | Status: AC | PRN
Start: 2014-11-12 — End: 2014-11-12
  Administered 2014-11-12: 10 mg via INTRAVENOUS

## 2014-11-12 MED ORDER — LEVOTHYROXINE SODIUM 88 MCG PO TABS
88.0000 ug | ORAL_TABLET | Freq: Every day | ORAL | Status: DC
Start: 2014-11-12 — End: 2014-11-19
  Administered 2014-11-13 – 2014-11-18 (×6): 88 ug via ORAL
  Filled 2014-11-12 (×10): qty 1

## 2014-11-12 MED ORDER — GLYCOPYRROLATE 0.2 MG/ML IJ SOLN
INTRAMUSCULAR | Status: DC | PRN
Start: 2014-11-12 — End: 2014-11-12
  Administered 2014-11-12: 0.4 mg via INTRAVENOUS

## 2014-11-12 MED ORDER — ASPIRIN 325 MG PO TBEC
325.0000 mg | DELAYED_RELEASE_TABLET | Freq: Every day | ORAL | Status: DC
Start: 2014-11-12 — End: 2014-11-19
  Administered 2014-11-13 – 2014-11-19 (×7): 325 mg via ORAL
  Filled 2014-11-12 (×8): qty 1

## 2014-11-12 MED ORDER — ONDANSETRON HCL 4 MG/2ML IJ SOLN
INTRAMUSCULAR | Status: DC | PRN
Start: 2014-11-12 — End: 2014-11-12
  Administered 2014-11-12: 4 mg via INTRAVENOUS

## 2014-11-12 MED ORDER — LIDOCAINE 1% BUFFERED - CNR/OUTSOURCED
INTRAMUSCULAR | Status: AC
Start: 2014-11-12 — End: ?
  Filled 2014-11-12: qty 22

## 2014-11-12 MED ORDER — FENTANYL CITRATE (PF) 50 MCG/ML IJ SOLN (WRAP)
50.0000 ug | Freq: Once | INTRAMUSCULAR | Status: AC
Start: 2014-11-12 — End: 2014-11-12
  Administered 2014-11-12: 50 ug via INTRAVENOUS

## 2014-11-12 MED ORDER — NEOSTIGMINE METHYLSULFATE 1 MG/ML IJ SOLN
INTRAMUSCULAR | Status: DC | PRN
Start: 2014-11-12 — End: 2014-11-12
  Administered 2014-11-12: 2.5 mg via INTRAVENOUS

## 2014-11-12 MED ORDER — FAMOTIDINE 20 MG PO TABS
20.0000 mg | ORAL_TABLET | Freq: Two times a day (BID) | ORAL | Status: DC
Start: 2014-11-12 — End: 2014-11-19
  Administered 2014-11-12 – 2014-11-19 (×15): 20 mg via ORAL
  Filled 2014-11-12 (×15): qty 1

## 2014-11-12 MED ORDER — IOHEXOL 350 MG/ML IV SOLN
100.0000 mL | Freq: Once | INTRAVENOUS | Status: AC | PRN
Start: 2014-11-12 — End: 2014-11-12
  Administered 2014-11-12: 100 mL via INTRAVENOUS

## 2014-11-12 MED ORDER — LIDOCAINE HCL 2 % IJ SOLN
INTRAMUSCULAR | Status: DC | PRN
Start: 2014-11-12 — End: 2014-11-12
  Administered 2014-11-12: 40 mg via INTRAVENOUS

## 2014-11-12 MED ORDER — ROCURONIUM BROMIDE 50 MG/5ML IV SOLN
INTRAVENOUS | Status: AC
Start: 2014-11-12 — End: ?
  Filled 2014-11-12: qty 5

## 2014-11-12 MED ORDER — DEXAMETHASONE SODIUM PHOSPHATE 4 MG/ML IJ SOLN
INTRAMUSCULAR | Status: AC
Start: 2014-11-12 — End: ?
  Filled 2014-11-12: qty 1

## 2014-11-12 MED ORDER — ACETAMINOPHEN 325 MG PO TABS
650.0000 mg | ORAL_TABLET | ORAL | Status: DC | PRN
Start: 2014-11-12 — End: 2014-11-13

## 2014-11-12 MED ORDER — LIDOCAINE 1% BUFFERED - CNR/OUTSOURCED
INTRAMUSCULAR | Status: AC | PRN
Start: 2014-11-12 — End: 2014-11-12
  Administered 2014-11-12: 10 mL via INTRADERMAL

## 2014-11-12 MED ORDER — FENTANYL CITRATE (PF) 50 MCG/ML IJ SOLN (WRAP)
25.0000 ug | Freq: Once | INTRAMUSCULAR | Status: AC
Start: 2014-11-12 — End: 2014-11-12
  Administered 2014-11-12: 25 ug via INTRAVENOUS

## 2014-11-12 MED ORDER — GLYCOPYRROLATE 0.2 MG/ML IJ SOLN
INTRAMUSCULAR | Status: AC
Start: 2014-11-12 — End: ?
  Filled 2014-11-12: qty 1

## 2014-11-12 MED ORDER — LIDOCAINE HCL (PF) 2 % IJ SOLN
INTRAMUSCULAR | Status: AC
Start: 2014-11-12 — End: ?
  Filled 2014-11-12: qty 5

## 2014-11-12 MED ORDER — PROPOFOL INFUSION 10 MG/ML
INTRAVENOUS | Status: DC | PRN
Start: 2014-11-12 — End: 2014-11-12
  Administered 2014-11-12: 170 mg via INTRAVENOUS

## 2014-11-12 MED ORDER — PHENYLEPHRINE 100 MCG/ML IN NACL 0.9% IV SOSY
PREFILLED_SYRINGE | INTRAVENOUS | Status: AC
Start: 2014-11-12 — End: ?
  Filled 2014-11-12: qty 5

## 2014-11-12 MED ORDER — ONDANSETRON HCL 4 MG/2ML IJ SOLN
INTRAMUSCULAR | Status: AC
Start: 2014-11-12 — End: ?
  Filled 2014-11-12: qty 2

## 2014-11-12 MED ORDER — HYDROMORPHONE HCL 2 MG PO TABS
2.0000 mg | ORAL_TABLET | Freq: Once | ORAL | Status: DC | PRN
Start: 2014-11-12 — End: 2014-11-13

## 2014-11-12 MED ORDER — SODIUM CHLORIDE 0.9 % IV SOLN
INTRAVENOUS | Status: DC
Start: 2014-11-12 — End: 2014-11-13

## 2014-11-12 MED ORDER — CLOPIDOGREL BISULFATE 75 MG PO TABS
75.0000 mg | ORAL_TABLET | Freq: Every day | ORAL | Status: DC
Start: 2014-11-12 — End: 2014-11-19
  Administered 2014-11-13 – 2014-11-19 (×7): 75 mg via ORAL
  Filled 2014-11-12 (×7): qty 1

## 2014-11-12 MED ORDER — HEPARIN (PORCINE) IN NACL 2-0.9 UNIT/ML-% IJ SOLN
INTRAMUSCULAR | Status: AC
Start: 2014-11-12 — End: ?
  Filled 2014-11-12: qty 1000

## 2014-11-12 MED ORDER — ROCURONIUM BROMIDE 50 MG/5ML IV SOLN
INTRAVENOUS | Status: DC | PRN
Start: 2014-11-12 — End: 2014-11-12
  Administered 2014-11-12: 20 mg via INTRAVENOUS
  Administered 2014-11-12: 50 mg via INTRAVENOUS
  Administered 2014-11-12: 20 mg via INTRAVENOUS

## 2014-11-12 MED ORDER — SODIUM CHLORIDE 0.9 % IV SOLN
INTRAVENOUS | Status: DC | PRN
Start: 2014-11-12 — End: 2014-11-12

## 2014-11-12 NOTE — Plan of Care (Signed)
Problem: Hemodynamic Status: Cardiac  Goal: Stable vital signs and fluid balance  Outcome: Not Progressing  Intervention: Assess incision for bleeding/hematoma.  At approximately 1830 pt described having sudden SOB and pinpoint stabbing pain on left groin site, right at the point of the venous dressing. Vascular checks, performed bilaterally repeated at this point, see flow sheet.  Notified Dr Put man to evaluate pt at bedside.  Neb treatment ordered, CBC done.  See orders.    1915 while doing Rn to RN hand off, noted left groin, swollen, and Very tender to the touch, pt was in 10/10 pain at this time, while X ray doing a portable chest x ray. Pt had strong pedal pulse, toes were warm, and able to wiggle toes. Pt stated the pain was intense but a little less when she applied pressure directly to the dressing site. I  Immediately informed charge nurse and Intensivist of the change, Stat C- Scan  Ordered pt en-route to scan with oncoming nurse now.      Comments:   Report given to Rayford Halsted at the bedside, while the changes were occuring. He is with the patient along with charge nurse in CT scan.  Emotional support given to pt's husband.

## 2014-11-12 NOTE — Sedation Documentation (Signed)
All vital signs, cardiac monitoring, IV Fluid administration, oxygenation and medication administration will be done by anesthesia. Please refer to anesthesia flow sheet for data.

## 2014-11-12 NOTE — Sedation Documentation (Signed)
Patient extubated and placed on 10 L oxygen via simple mask. Following commands and answering questions correctly.

## 2014-11-12 NOTE — Progress Notes (Signed)
Upon arrival to NSICU right groin dressing saturated with blood. Pressure held first by NSICU RN then by [facility] nurse for a total of 10 minutes with good effect (Pressure held until hemostasis achieved). Clean new dressing applied. Good pulses noted to both feet. No further bleeding noted. VS remain stable. Pt was instructed to hold pressure to right groin if need to cough or sneeze. Verbalized understanding of instructions.

## 2014-11-12 NOTE — Anesthesia Postprocedure Evaluation (Signed)
Anesthesia Post Evaluation    Patient: Alexandria Trevino    Procedure(s):  NEURO EMBOLIZATION    Anesthesia type: general    Last Vitals:   Filed Vitals:    11/12/14 1815   BP:    Pulse: 68   Temp:    Resp: 26   SpO2: 94%       Patient Location: PACU      Post Pain: Patient not complaining of pain, continue current therapy    Mental Status: awake    Respiratory Function: tolerating face mask    Cardiovascular: stable    Nausea/Vomiting: patient not complaining of nausea or vomiting    Hydration Status: adequate    Post Assessment: no apparent anesthetic complications          Anesthesia Qualified Clinical Data Registry    Central Line      CVC insertion : NO                                               Perioperative temperature management      General/neuraxial anesthesia > or = 60 minutes (excluding CABG) : YES              > Use of intraoperative active warming : YES              > Temperature > or = 36 degrees Centigrade (96.8 degrees Farenheit) during time span from 30 minutes before up to 15 minutes after anesthesia end time : YES      Administration of antibiotic prophylaxis      Age > or = 18, with IV access, with surgical procedure for which antibiotic prophylaxis indicated, and not on chronic antibiotics : YES              > Prophylactic antibiotics within 1 hour of incision (or fluroroquinolone/vancomycin within 2 hours of incision) : YES    Medication Administration      Ordering or administration of drug inconsistent with intended drug, dose, delivery or timing : NO      Dental loss/damage      Dental injury with administration of anesthesia : NO      Difficult intubation due to unrecognized difficult airway        Elective airway procedure including but not limited to: tracheostomy, fiberoptic bronchoscopy, rigid bronchoscopy; jet ventilation; or elective use of a device to facilitate airway management such as a Glidescope : NO                > Unanticipated difficult intubation post pre-evaluation :  NO      Aspiration of gastric contents        Aspiration of gastric contents : NO                    Surgical fire        Procedure requiring electrocautery/laser : NO                    Immediate perioperative cardiac arrest        Cardiac arrest in OR or PACU : NO                    Unplanned hospital admission        Unplanned hospital admission for initially intended outpatient anesthesia service : NO  Unplanned ICU admission        Unplanned ICU admission related to anesthesia occurring within 24 hours of induction or start of MAC : NO      Surgical case cancellation        Cancellation of procedure after care already started by anesthesia care team : NO      Post-anesthesia transfer of care checklist/protocol to PACU        Transfer from OR to PACU upon case conclusion : YES              > Use of PACU transfer checklist/protocol : YES     (Includes the key elements of: patient identification, responsible practitioner identification (PACU nurse or advanced practitioner), discussion of pertinent history and procedure course, intraoperative anesthetic management, post-procedure plans, acknowledgement/questions)    Post-anesthesia transfer of care checklist/protocol to ICU        Transfer from OR to ICU upon case conclusion : YES                > Use of ICU transfer checklist/protocol : YES     (Includes the key elements of: patient identification, responsible practitioner identification (ICU nurse or advanced practitioner), discussion of pertinent history and procedure course, intraoperative anesthetic management, post-procedure plans, acknowledgement/questions)     Post-operative nausea/vomiting risk protocol        Post-operative nausea/vomiting risk protocol : NO  Patient > or = 18 with care initiated by anesthesia team that has a risk factor screen for post-op nausea/vomiting (Includes female, hx PONV, or motion sickness, non-smoker, intended opioid administration for post-op analgesia.)    Anaphylaxis         Anaphylaxis during anesthesia services : NO    (Inclusive of any suspected transfusion reaction in association with blood-bank confirmed blood product incompatibility)              Talmage Nap, 11/12/2014 7:04 PM

## 2014-11-12 NOTE — Progress Notes (Signed)
Pt started c/o L thigh pain; agitated, HTN with SBP 160-180. Also c/o new onset CP and SOB. No HA, no visual changes.    VS reviewed.  Neuro: AAOx3, agitated, MAE; pupil s4 mm, reactive, EOMI  Extr: developing L groin hematoma; + DP pulses     Mesd, labs and imagistic studies reviewed.    A/P: RP - STAT abdomen and pelvis CT showing L abdominal wall hematoma, likely from venous source with active contrast extravasation.  - local pressure, BP control,  Pain conrol  - check H/H q 6h.  - now hypotensive - likley from Fentanyl 50 mcg x1; will bolus NS x 1L.  2: R/o PE - STAT chest CTA negative for PE.  3: CCF s/p coil embolization - stable post-op, no new complaints.  - will continue ASA and Plavix for Pipeline (d/c NIR Putman).  Husband updated at bedside.    CCM time x 35 min.    Goldman Birchall MD  11/12/2014 20:53

## 2014-11-12 NOTE — Sedation Documentation (Signed)
Pt able to move without assistance from stretcher to procedural table without difficulty. Lying supine on table.

## 2014-11-12 NOTE — Transfer of Care (Signed)
Anesthesia Transfer of Care Note    Patient: Alexandria Trevino    Procedures performed: Procedure(s):  NEURO EMBOLIZATION    Anesthesia type: General ETT    Patient location:Neuro ICU    Last vitals:   Filed Vitals:    11/12/14 1724   BP: 153/83   Pulse: 77   Resp: 16   SpO2: 100%       Post pain: Patient not complaining of pain, continue current therapy      Mental Status:awake and alert     Respiratory Function: tolerating face mask    Cardiovascular: stable    Nausea/Vomiting: patient not complaining of nausea or vomiting    Hydration Status: adequate    Post assessment: no apparent anesthetic complications, no reportable events and no evidence of recall

## 2014-11-12 NOTE — Sedation Documentation (Addendum)
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 Aleen Sells and Dr. Jimmie Molly.

## 2014-11-12 NOTE — Sedation Documentation (Signed)
Pre anesthesia  pause done. Name, DOB , MRN and type of procedure to be done verbalized. All team members in agreement with information.

## 2014-11-12 NOTE — Sedation Documentation (Signed)
#  20G right radial A-line placed without incidence. Aline secured with suture and dressing.

## 2014-11-12 NOTE — Sedation Documentation (Signed)
Pt intubated with  # 7.0 ETT and 22 cm at the lip. Intubated without difficulty by anesthesia.

## 2014-11-12 NOTE — Sedation Documentation (Addendum)
Left groin accessed.  6 Fr. Venous sheath placed without complication. Pt tolerated well. In NAD. VS Stable.

## 2014-11-12 NOTE — H&P (Signed)
INR    59 y/o woman s/p Pipeline placement for RICA cavernous aneurysm developed new right sided bruit, proptosis, chemosis and double vision, and severe headache.  CTA shows right CCF. Admitted for management.    PMH:  Hypothyroidism  Asthma  Anemia  Arthritis    All: Codeine, erythromycin, latex, tape    PE:    Vss    Right ptosis and proptosis, decrease lateral motion. Chemosis. Pupils reactive.  See fingers.    No weakness, numbness  Intact mental status    Chest and abdomen normal      Impression:    Admit post angio and embolization for right CCF.    Amedeo Kinsman MD

## 2014-11-12 NOTE — Progress Notes (Unsigned)
Pt seen in office post embolization with proptosis/chemosis. Denies pain. Dr Aleen Sells states he is able to a bruit.  Pt sent over for a CTA.

## 2014-11-12 NOTE — Progress Notes (Addendum)
n

## 2014-11-12 NOTE — Discharge Instructions (Addendum)
Please have PCP check CBC in one week for follow up on anemia.   BMP in one week for Sodium check.     Continue fluid restriction 1.2L. Follow up with the pcp per the lab results. May be able to release the fluid restriction.                   Endovascular Treatment Discharge Instructions  Having an endovascular procedure can be as stressful on your body as open surgery. Once you are discharged from the hospital and are home you need to rest, allowing yourself time to heal You may remove the bandage in 2 days.  We want to see you in the office FOUR WEEKS after you are discharged from the hospital. Please call the office to schedule your follow up appointment: [phone]  ACTIVITY  -No heavy lifting (greater than 10 lbs) for 1 week  -No strenuous or aerobic activity x 1 week including jogging, biking, power walking, aerobic classes; you may take leisurely walks  -No sexual activity x 1 week  -No tub baths, swimming pools, hot tubs for 7 days from treatment or until groin is healed  -If you have had a brain hemorrhage, avoid caffeine or products containing caffeine until your follow up visit with the Neurosurgeon, and/or Interventional Neuroradiologist   MEDICATION  Take medication as directed by the Interventional Neuroradiologist/Neurosurgeon.  What you may experience  Mild tenderness and bruising for a few days at the puncture site  Mild headaches for several days  If you have had an embolization procedure, you might experience some hair loss; this is normal. It will grow back. Please contact our office if you should have this experience  Report these symptoms  Severe pain, redness or drainage from the puncture site  Bleeding or hematoma (firm, raised area of blood trapped in the tissue) at puncture site  Temperature greater than 101.0  Numbness, tingling, swelling, change in color or temperature of feet, leg, or groin  Severe headaches and/or neck pain  Seizures  Extremity weakness  Visual problems  Please  call [phone] with any questions or concerns. There is a provider available 24 hours per day

## 2014-11-12 NOTE — Sedation Documentation (Addendum)
Right groin accessed.  5 Fr. Arterial sheath placed without complication. Pt tolerated well. In NAD. VS Stable.

## 2014-11-12 NOTE — Sedation Documentation (Signed)
Patient prepped and draped in usual sterile fashion.  Lying in supine position. Both arms down by her side. Padded arm boards in place. Body in good alignment.

## 2014-11-12 NOTE — Anesthesia Preprocedure Evaluation (Addendum)
Anesthesia Evaluation    AIRWAY    Mallampati: I    TM distance: >3 FB  Neck ROM: full  Mouth Opening:full   CARDIOVASCULAR           DENTAL    no notable dental hx     PULMONARY         OTHER FINDINGS    Right eye injected and erythematous                Anesthesia Plan    ASA 3 - emergent     general                     intravenous induction     Monitors/Adjuncts: arterial line      Post op pain management: per surgeon    informed consent obtained    Plan discussed with CRNA.

## 2014-11-12 NOTE — Sedation Documentation (Addendum)
Fluoroscopy Guided Cerebral angiogram & embolization of right carotid cavernous fistula (11 coils deployed) performed by Dr.'s Aleen Sells and Jimmie Molly without incidence. Pt tolerated procedure well. In NAD. VS stable. 5 Fr. Mynx closure device placed and arterial sheath removed. Pressure held to groin x 5 minutes with no hematoma or bleeding noted.  Left venous groin sheath removed and pressure held x 10 mins. ood pulses noted to both feet. No pain or discomfort noted at this time. Awaiting transport to NSICU after patient extubated.

## 2014-11-12 NOTE — Procedures (Signed)
Right CCF with cortical venous and retrograde SOV drainage and right hemisphere underperfusion. Right cav aneurysm patent.  Pipeline device in place and patent.  Embolization with DCS orbit coils via transvenous approach through the IPS with near complete occlusion.

## 2014-11-13 ENCOUNTER — Encounter: Payer: Self-pay | Admitting: Internal Medicine

## 2014-11-13 ENCOUNTER — Inpatient Hospital Stay: Payer: BLUE CROSS/BLUE SHIELD

## 2014-11-13 DIAGNOSIS — E039 Hypothyroidism, unspecified: Secondary | ICD-10-CM | POA: Diagnosis present

## 2014-11-13 DIAGNOSIS — T148XXA Other injury of unspecified body region, initial encounter: Secondary | ICD-10-CM | POA: Diagnosis present

## 2014-11-13 DIAGNOSIS — M1990 Unspecified osteoarthritis, unspecified site: Secondary | ICD-10-CM | POA: Diagnosis present

## 2014-11-13 DIAGNOSIS — J45909 Unspecified asthma, uncomplicated: Secondary | ICD-10-CM | POA: Diagnosis present

## 2014-11-13 DIAGNOSIS — D649 Anemia, unspecified: Secondary | ICD-10-CM | POA: Diagnosis present

## 2014-11-13 DIAGNOSIS — T148 Other injury of unspecified body region: Secondary | ICD-10-CM

## 2014-11-13 DIAGNOSIS — S301XXA Contusion of abdominal wall, initial encounter: Secondary | ICD-10-CM

## 2014-11-13 DIAGNOSIS — J4520 Mild intermittent asthma, uncomplicated: Secondary | ICD-10-CM

## 2014-11-13 LAB — GFR: EGFR: >60

## 2014-11-13 LAB — CBC
Hematocrit: 27.6 % — ABNORMAL LOW (ref 37.0–47.0)
Hgb: 9.6 g/dL — ABNORMAL LOW (ref 12.0–16.0)
MCH: 30.2 pg (ref 28.0–32.0)
MCHC: 34.8 g/dL (ref 32.0–36.0)
MCV: 86.8 fL (ref 80.0–100.0)
MPV: 8.8 fL — ABNORMAL LOW (ref 9.4–12.3)
Nucleated RBC: 0 /100 WBC (ref 0–1)
Platelets: 390 10*3/uL (ref 140–400)
RBC: 3.18 10*6/uL — ABNORMAL LOW (ref 4.20–5.40)
RDW: 13 % (ref 12–15)
WBC: 10.55 10*3/uL (ref 3.50–10.80)

## 2014-11-13 LAB — HEMOGLOBIN AND HEMATOCRIT, BLOOD
Hematocrit: 27.3 % — ABNORMAL LOW (ref 37.0–47.0)
Hematocrit: 26.2 % — ABNORMAL LOW (ref 37.0–47.0)
Hgb: 9.2 g/dL — ABNORMAL LOW (ref 12.0–16.0)
Hgb: 8.9 g/dL — ABNORMAL LOW (ref 12.0–16.0)

## 2014-11-13 LAB — BASIC METABOLIC PANEL
BUN: 12 mg/dL (ref 7.0–19.0)
CO2: 18 mEq/L — ABNORMAL LOW (ref 22–29)
Calcium: 8.2 mg/dL — ABNORMAL LOW (ref 8.5–10.5)
Chloride: 107 mEq/L (ref 100–111)
Creatinine: 0.7 mg/dL (ref 0.6–1.0)
Glucose: 162 mg/dL — ABNORMAL HIGH (ref 70–100)
Potassium: 4.5 mEq/L (ref 3.5–5.1)
Sodium: 135 mEq/L — ABNORMAL LOW (ref 136–145)

## 2014-11-13 LAB — PT/INR
PT INR: 1.2 — ABNORMAL HIGH (ref 0.9–1.1)
PT: 14.8 s (ref 12.6–15.0)

## 2014-11-13 LAB — APTT: PTT: 22 s — ABNORMAL LOW (ref 23–37)

## 2014-11-13 LAB — TYPE AND SCREEN
AB Screen Gel: NEGATIVE
ABO Rh: O POS

## 2014-11-13 MED ORDER — HYDROMORPHONE HCL 1 MG/ML IJ SOLN
0.5000 mg | INTRAMUSCULAR | Status: DC | PRN
Start: 2014-11-13 — End: 2014-11-13
  Administered 2014-11-13 (×2): 0.5 mg via INTRAVENOUS
  Filled 2014-11-13 (×2): qty 1

## 2014-11-13 MED ORDER — HYDRALAZINE HCL 20 MG/ML IJ SOLN
10.0000 mg | INTRAMUSCULAR | Status: DC | PRN
Start: 2014-11-13 — End: 2014-11-19

## 2014-11-13 MED ORDER — SODIUM CHLORIDE 0.9 % IV SOLN
INTRAVENOUS | Status: DC
Start: 2014-11-13 — End: 2014-11-13

## 2014-11-13 MED ORDER — HYDROMORPHONE HCL 1 MG/ML IJ SOLN
1.0000 mg | INTRAMUSCULAR | Status: DC | PRN
Start: 2014-11-13 — End: 2014-11-19
  Administered 2014-11-13 – 2014-11-17 (×14): 1 mg via INTRAVENOUS
  Administered 2014-11-19: 0.5 mg via INTRAVENOUS
  Filled 2014-11-13 (×15): qty 1

## 2014-11-13 MED ORDER — SODIUM CHLORIDE 0.9 % IV BOLUS
500.0000 mL | Freq: Once | INTRAVENOUS | Status: AC
Start: 2014-11-13 — End: 2014-11-13
  Administered 2014-11-13: 500 mL via INTRAVENOUS

## 2014-11-13 MED ORDER — CARBOXYMETHYLCELLULOSE SODIUM 0.5 % OP SOLN
1.0000 [drp] | Freq: Three times a day (TID) | OPHTHALMIC | Status: DC | PRN
Start: 2014-11-13 — End: 2014-11-19
  Administered 2014-11-13: 1 [drp] via OPHTHALMIC
  Filled 2014-11-13 (×5): qty 1

## 2014-11-13 MED ORDER — ACETAMINOPHEN 325 MG PO TABS
650.0000 mg | ORAL_TABLET | ORAL | Status: DC | PRN
Start: 2014-11-13 — End: 2014-11-19
  Administered 2014-11-14: 650 mg via ORAL
  Filled 2014-11-13: qty 2

## 2014-11-13 MED ORDER — SENNOSIDES-DOCUSATE SODIUM 8.6-50 MG PO TABS
2.0000 | ORAL_TABLET | Freq: Two times a day (BID) | ORAL | Status: DC
Start: 2014-11-13 — End: 2014-11-19
  Administered 2014-11-13 – 2014-11-18 (×10): 2 via ORAL
  Filled 2014-11-13 (×11): qty 2

## 2014-11-13 MED ORDER — LABETALOL HCL 5 MG/ML IV SOLN
10.0000 mg | INTRAVENOUS | Status: DC | PRN
Start: 2014-11-13 — End: 2014-11-19

## 2014-11-13 MED ORDER — CHLORHEXIDINE GLUCONATE 0.12 % MT SOLN
15.0000 mL | Freq: Two times a day (BID) | OROMUCOSAL | Status: DC
Start: 2014-11-13 — End: 2014-11-13

## 2014-11-13 MED ORDER — OXYCODONE HCL 5 MG PO TABS
10.0000 mg | ORAL_TABLET | ORAL | Status: DC | PRN
Start: 2014-11-13 — End: 2014-11-19
  Administered 2014-11-13 – 2014-11-19 (×23): 10 mg via ORAL
  Filled 2014-11-13 (×23): qty 2

## 2014-11-13 MED ORDER — ONDANSETRON HCL 4 MG/2ML IJ SOLN
4.0000 mg | Freq: Four times a day (QID) | INTRAMUSCULAR | Status: DC | PRN
Start: 2014-11-13 — End: 2014-11-19
  Administered 2014-11-13 – 2014-11-19 (×10): 4 mg via INTRAVENOUS
  Filled 2014-11-13 (×10): qty 2

## 2014-11-13 NOTE — Progress Notes (Signed)
Neurocritical Care Daily Progress Note    Patient Name: Alexandria Trevino, Alexandria Trevino  Room:F713/F713.01 Date Time: 11/13/2014 4:56 PM   Admit Date: 11/12/2014    Hospital LOS# 1  No chief complaint on file.      Presentation/Hospital course:   59 yo female with PMHx of HTN, asthma, hypothyroidism s/p Pipeline placement for RICA cavernous aneurysm on 10/25/16, developed new right sided bruit, proptosis, chemosis and double vision, and severe headache, who was admitted for an elective coil embolization of Rt CCF evidenced on CTA    24 hr Events: pt developed acute SOB and left groin pain last night.  Abdom/pelvic CT scan revealed large left groin hematoma with active extravasation of contrast material, possible from femoral vein.  Local pressure applied to left groin and pain controlled. CBC every 6 hours revealed acute drop in H/H from 11/32 to 9/27 but pt did not need blood products.  Left groin [place] today r/o pseudoaneurysm.    ASSESSMENT                               PLAN    Rt CCF s/p coil embolization  S/p pipeline placement for RICA cavernous aneurysm on 10/25/16  Pipe line patent and in place  Left groin hematoma  Anemia due to blood loss    PMH:  Hypothyroidism  Asthma  Anemia  Arthritis NEURO: Rt CCF s/p coil embolization.  Complicated by 7.6 cm left groin hematoma and presently stable    -continue ASA and plavix  -monitor pulses peripherally, especially of LLE  -Goals:   Perfusion goals: SBP 90-150  Na+ goal: 135-140  Pain management with percocet    PT/OT/Speech/swallow eval: done    CV: Perfusion goals as above, PRN IV hydralazine and labetalol for goal    PULM: extubated after procedure. IS.  Albuterol nebs prn for asthma    GI:   GI PPX: famotidine  Nutrition: advance diet as tolerated    RENAL: nai  I/O Goal: ar    ID: afebrile.     HEME: anemia due to acute blood loss and presently stable.  No blood products administered  SCD's  Pharmacologic ppx: not safe secondary to acute bleed past 24 hours    ENDO/RHEUM:  hypothyroidism - on levothyroxine  Glucose: goal 100 -180    Lines/Foley: PIV. No foley  Daily labs/imaging: ordered  Family Meeting:husband at bedside    VITALS, LINES, IMAGING, LABS, MICRO, MEDS, GOALS reviewed and d/w bedside nurse   Code status Full Code  DISPO: Needs ICU      Physical Exam:   Vitals reviewed in EPIC    General Appearance:   Vent: none       Lines: PIV        Foley: none       Surgical sites:     NEURO EXAM:  Mental Status:  Sedation: none                Opens eyes: spont EO      Orientation: x3          Language: fluent          Cranial Nerves  Pupils  OS: size/reactivity: 4R              OD: size/reactivity: 4R                    EOM(III/IV/VI): intact  Visual field: no diplopia, VF intact        Corneal(V/VII):         Face(VII): sym  Shoulder shrug/head turn(XI):          Cough/gag(IX/X):                      Uvula (IX/X): upoing                 Tongue(XII): ML        Motor:  UE   Deltoid  Bicep  Tricep  WE  WF  Grip    R  5/5         L 5/5           LE   Hip F Hip E Knee F  Knee E  DF  PF    R  5/5         L 5/5         Drift: none  Sensory: intact to light touch   Cerebellar:   Reflexes/Babinki:    Other:   Bedside ultrasound:      Heart: rrr. s1s2 nl, no murmur    Lungs: ctab  Abd: soft, nt/nd, +bs        Ext: no edema  Skin: Lt groin with hematoma extending to upper thigh.  Soft but tender    Medications:   Scheduled Meds: reviewed     Labs:   reviewed    Rads:   Radiological Imaging personally reviewed:      I have personally reviewed the patient's history and 24 hour interval events, along with vitals, labs, radiology images and ventilator settings. So far today I have spent providing care for this patient excluding teaching and billable procedures, and not overlapping with any other providers.    Sharen Hint, M.D.  #[id number]  Medical Critical Care Service  [facility]  Department of Medicine    Signed by: Rachelle Hora, MD      Date/Time: 11/13/2014 4:56 PM  Neuro Critical Care.   Attending MD [phone] (24hr)  Midlevel Provider [phone] (7a-7p)

## 2014-11-13 NOTE — Plan of Care (Signed)
Problem: Safety  Goal: Patient will be free from injury during hospitalization  Outcome: Progressing    Comments:   Pt AO x 4, VSS, admitted from the NSICU Judeth Cornfield RN) about 1800.  Patient complained of 9/10 pain in her eyes, exacerbated by travel, and given dilaudid x 1.  Foley pulled about 1730, patient has had a scant amount of urine about 1845.  Hematoma at left groin still within parameters of marked original outline.  Fall mats down, bed alarm on, safety precautions in place - will continue to monitor.

## 2014-11-13 NOTE — Progress Notes (Signed)
INR    POD #1 s/p embolization Right CCF    Right eye pain much improved now with just some pressure.  C/o left eye pain - scratchy eye, throat sore, and back pain.  Intense pain in Left groin area with palpation.      PE:    Vss  Mental status intact.  Ptosis/proptosis nearly resolved. Chemosis resolved.  Mild restriction lateral gaze (improved), normal pupillary reaction  Strength full    U/S left groin no evidence of pseudo aneurysm.  Large anterior hematoma    Impression:    Stable. Transfer to floor. Will need OT/PT evaluation, increased in activity, pain control.      Amedeo Kinsman MD

## 2014-11-13 NOTE — H&P (Signed)
[facility] HOSPITALIST ICU TRANSFER NOTE    This patient is admitted to the CNS Hospitalist Service. A CNS MD is in-house 24/7. To contact the CNS MD, please page the listed attending CNS MD.  If no response, please page [phone] to reach the CNS MD on call.    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   Plan:   Transfer to neuro tele.  Neuro IR consult.  Follow post op recommendations.  Neuro check q4hrs.  Monitor H/H q8hrs.  Continue current hospital meds: asa, plavix, pepcid, synthroid.  SCD's for DVT prophylaxis.    History of Presenting Illness:   Alexandria Trevino is a 59 y.o. 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  Pt was admitted on 11/12/14 for elective coil embolization.  Pt's post op course was complicated by left groin hematoma (H/H: 11/31 down to 9/27, no transfusion required).  Pt is now stable for transfer to neuro tele. Pt complains of right eye pain but decreased overall.  Some left eye irritation.  Pain in left groin but not worsening.      Past Medical History:     Past  Medical History   Diagnosis Date   . Hypothyroidism    . Asthma      exercise induced   . Anemia      Hx pre hysterectomy   . Arthritis      L thumb   . History of YAG laser capsulotomy of lens    . Brain aneurysm      RICA/tx'd 10/2014       Available old records reviewed, including: EPIC   Past Surgical History:     Past Surgical History   Procedure Laterality Date   . Tonsillectomy  1970   . Breast, lumpectomy  1979     fibroid tumor   . Hysterectomy  1989     total abd hysterectomy   . Leg surgery Right 2008     spiral fx fib/ankle surgery   . Arm surgery Right 2003     tendon repair/upper and lower   . Cataract extraction Right 2015 x2     2nd due to lens slipping   . Cataract extraction Left 2015   . Sst  2014     L hand   . Heel spur surgery Left 2004   . Heel spur surgery Right 2005 & 2006   . Cerebral embolization  10/2014     PIPELINE STENT / RICA        Family History:     Family History  Problem Relation Age of Onset   . Cancer Mother    . Heart disease Father    . Cancer Brother          Social History:     History   Smoking status   . Former Smoker -- 0.25 packs/day for 20 years   . Quit date: 08/09/2006   Smokeless tobacco   . Not on file     History   Alcohol Use   . Yes     Comment: rarely     History   Drug Use No       Allergies:     Allergies   Allergen Reactions   . Codeine Nausea And Vomiting   . Erythromycin Nausea And Vomiting   . Latex Itching   . Tape Rash     Blisters and makes her skin come off/ paper tape is ok       Medications:     F713-F713.01 - MAR ACTION REPORT  (last 24 hrs)         ATABONG, ATEMNKENG, RT       Medication Name Action Time Action Site Route Rate Dose Reason Comments User     albuterol (PROVENTIL) nebulizer solution 2.5 mg 11/12/14 1929 Given  Nebulization  2.5 mg   Atabong, Atemnkeng, RT          HAIDUC, DANIELA, RN       Medication Name Action Time Action Site Route Rate Dose Reason Comments User     carboxymethylcellulose (PF) (REFRESH PLUS) 0.5 % ophthalmic  solution 1 drop 11/13/14 1438 Given  Both Eyes  1 drop   Haiduc, Daniela, RN     oxyCODONE (ROXICODONE) immediate release tablet 10 mg 11/13/14 1401 Given  Oral  10 mg   Haiduc, Daniela, RN          Eugenie Filler, RN       Medication Name Action Time Action Site Route Rate Dose Reason Comments User     sodium chloride 0.9 % bolus 500 mL 11/13/14 1515 New Bag  Intravenous 1,000 mL/hr 500 mL   Eugenie Filler, RN          Nicholes Stairs, RN       Medication Name Action Time Action Site Route Rate Dose Reason Comments User     aspirin EC EC tablet 325 mg 11/13/14 1054 Given  Oral  325 mg   Nicholes Stairs, RN     clopidogrel (PLAVIX) tablet 75 mg 11/13/14 1054 Given  Oral  75 mg   Nicholes Stairs, RN     famotidine (PEPCID) tablet 20 mg 11/13/14 1054 Given  Oral  20 mg   Nicholes Stairs, RN          Gayland Curry, RN       Medication Name Action Time Action Site Route Rate Dose Reason Comments User     levothyroxine (SYNTHROID, LEVOTHROID) tablet 88 mcg 11/12/14 1825 Not Given  Oral  88 mcg Contraindicated took it  this am Gayland Curry, RN     ondansetron [facility]) injection 4 mg 11/12/14 1847 Given  Intravenous  4 mg   Gayland Curry, RN          RAMBO, RONALD Y       Medication Name Action Time Action Site Route Rate Dose Reason Comments User     iodixanol (VISIPAQUE) 320 MG/ML injection 100 mL 11/12/14 2013 Given  Intravenous  100 mL   Jeri Cos  Idamae Lusher, RN       Medication Name Action Time Action Site Route Rate Dose Reason Comments User     aspirin EC EC tablet 325 mg 11/12/14 2204 Not Given  Oral  325 mg Contraindicated  Idamae Lusher, RN     clopidogrel (PLAVIX) tablet 75 mg 11/12/14 2205 Not Given  Oral  75 mg Contraindicated  Idamae Lusher, RN     famotidine (PEPCID) tablet 20 mg 11/12/14 2218 Given  Oral  20 mg   Idamae Lusher, RN     levothyroxine (SYNTHROID, LEVOTHROID) tablet 88 mcg 11/13/14 0600 Given  Oral  88 mcg   Idamae Lusher, RN      metoclopramide [facility]) injection 10 mg 11/12/14 2228 Given  Intravenous  10 mg   Idamae Lusher, RN     ondansetron [facility]) injection 4 mg 11/13/14 1308 Given  Intravenous  4 mg   Idamae Lusher, RN                  Review of Systems:   All other systems were reviewed and are negative.   Physical Exam:   Patient Vitals for the past 24 hrs:   BP Temp Temp src Pulse Resp SpO2 Height Weight   11/13/14 1600 96/53 mmHg 98.3 F (36.8 C) Oral 78 17 99 % - -   11/13/14 1500 (!) 85/51 mmHg - - 69 14 97 % - -   11/13/14 1400 96/56 mmHg - - 89 (!) 26 98 % - -   11/13/14 1300 123/75 mmHg - - 86 (!) 25 98 % - -   11/13/14 1200 111/68 mmHg 98.1 F (36.7 C) Oral 80 22 97 % - -   11/13/14 1100 121/63 mmHg - - 85 (!) 28 98 % - -   11/13/14 1000 105/55 mmHg - - 71 14 97 % - -   11/13/14 0900 103/58 mmHg - - 86 (!) 27 99 % - -   11/13/14 0800 97/49 mmHg 97.5 F (36.4 C) Axillary 67 15 98 % - -   11/13/14 0700 112/63 mmHg - - 64 13 97 % - -   11/13/14 0600 116/66 mmHg - - 74 19 99 % - -   11/13/14 0500 98/62 mmHg - - 64 19 98 % - -   11/13/14 0400 100/57 mmHg 98.2 F (36.8 C) Oral 63 14 97 % - -   11/13/14 0300 102/58 mmHg - - 80 18 98 % - -   11/13/14 0200 111/66 mmHg - - 68 15 99 % - -   11/13/14 0100 116/61 mmHg - - 83 20 99 % - -   11/13/14 0000 114/67 mmHg 98.8 F (37.1 C) Axillary 67 18 100 % - -   11/12/14 2300 102/57 mmHg - - 63 15 96 % - -   11/12/14 2200 101/64 mmHg - - 67 13 97 % - -   11/12/14 2100 106/56 mmHg - - 95 18 96 % - -   11/12/14 2000 128/73 mmHg 98.2 F (36.8 C) Axillary (!) 130 (!) 44 100 % - -   11/12/14 1900 157/77 mmHg - - 70 (!) 26 98 % - -   11/12/14 1830 128/76 mmHg - - 66 (!) 24 95 % - -   11/12/14 1815 - - - 68 (!) 26 94 % - -   11/12/14 1800 121/73 mmHg 98.3 F (36.8 C) Axillary 69 22 94 % - -   11/12/14 1745 125/74 mmHg - -  71 (!) 27 95 % 1.676 m (5\' 6" ) 103.874 kg (229 lb)     Body mass index is 36.98 kg/(m^2).    Intake/Output Summary (Last 24 hours) at 11/13/14 1731  Last data filed  at 11/13/14 1400   Gross per 24 hour   Intake 3101.07 ml   Output   1075 ml   Net 2026.07 ml       General: awake, alert, oriented x 3; no acute distress; VS noted  HEENT: perrla, eomi, sclera anicteric  oropharynx clear without lesions, mucous membranes moist  Neck: supple, no lymphadenopathy, no thyromegaly, no JVD, no carotid bruits  Cardiovascular: regular rate and rhythm, no murmurs, rubs or gallops  Lungs: clear to auscultation bilaterally, without wheezing, rhonchi, or rales  Abdomen: soft, non-tender, non-distended; no palpable masses, no hepatosplenomegaly, normoactive bowel sounds, no rebound or guarding  Extremities: no clubbing, cyanosis, left groin hematoma extending to upper thigh  Neuro: cranial nerves 2-12 grossly intact, strength 5/5 in upper and lower extremities, sensation intact,   Skin: left groin hematoma as above      I have personally reviewed the following labs and diagnostic studies.  Labs:     Results     Procedure Component Value Units Date/Time    Hemoglobin and hematocrit, blood [[id number]]  (Abnormal) Collected:  11/13/14 1516    Specimen Information:  Blood Updated:  11/13/14 1541     Hgb 8.9 (L) g/dL      Hematocrit 84.6 (L) %     APTT [[id number]]  (Abnormal) Collected:  11/13/14 1006     PTT 22 (L) sec Updated:  11/13/14 1048    Prothrombin time/INR [[id number]]  (Abnormal) Collected:  11/13/14 1006    Specimen Information:  Blood Updated:  11/13/14 1048     PT 14.8 sec      PT INR 1.2 (H)      PT Anticoag. Given Within 48 hrs. None     Hemoglobin and hematocrit, blood [[id number]]  (Abnormal) Collected:  11/13/14 1006    Specimen Information:  Blood Updated:  11/13/14 1029     Hgb 9.2 (L) g/dL      Hematocrit 47.4 (L) %     MRSA culture [[id number]] Collected:  11/13/14 0430    Specimen Information:  Body Fluid from Nares and Throat Updated:  11/13/14 0937    Type and Screen [[id number]] Collected:  11/13/14 0430    Specimen Information:  Blood Updated:  11/13/14 0622     ABO Rh O POS       AB Screen Gel NEG     Basic Metabolic Panel [[id number]]  (Abnormal) Collected:  11/13/14 0430    Specimen Information:  Blood Updated:  11/13/14 0520     Glucose 162 (H) mg/dL      BUN 16.0 mg/dL      Creatinine 0.7 mg/dL      Calcium 8.2 (L) mg/dL      Sodium 109 (L) mEq/L      Potassium 4.5 mEq/L      Chloride 107 mEq/L      CO2 18 (L) mEq/L     GFR [[id number]] Collected:  11/13/14 0430     EGFR >60.0 Updated:  11/13/14 0520    CBC without differential [[id number]]  (Abnormal) Collected:  11/13/14 0430    Specimen Information:  Blood from Blood Updated:  11/13/14 0512     WBC 10.55 x10 3/uL      Hgb 9.6 (L) g/dL  Hematocrit 27.6 (L) %      Platelets 390 x10 3/uL      RBC 3.18 (L) x10 6/uL      MCV 86.8 fL      MCH 30.2 pg      MCHC 34.8 g/dL      RDW 13 %      MPV 8.8 (L) fL      Nucleated RBC 0 /100 WBC     Basic Metabolic Panel (if NOT done in last 48) [[id number]]  (Abnormal) Collected:  11/12/14 2056    Specimen Information:  Blood Updated:  11/12/14 2121     Glucose 197 (H) mg/dL      BUN 9.0 mg/dL      Creatinine 0.8 mg/dL      Calcium 8.3 (L) mg/dL      Sodium 409 (L) mEq/L      Potassium 4.3 mEq/L      Chloride 105 mEq/L      CO2 18 (L) mEq/L     GFR [[id number]] Collected:  11/12/14 2056     EGFR >60.0 Updated:  11/12/14 2121    Hemoglobin and hematocrit, blood [[id number]]  (Abnormal) Collected:  11/12/14 2056    Specimen Information:  Blood Updated:  11/12/14 2109     Hgb 10.9 (L) g/dL      Hematocrit 57.8 (L) %     CBC without differential [[id number]]  (Abnormal) Collected:  11/12/14 1927    Specimen Information:  Blood from Blood Updated:  11/12/14 1950     WBC 7.28 x10 3/uL      Hgb 11.9 (L) g/dL      Hematocrit 41.3 (L) %      Platelets 372 x10 3/uL      RBC 3.93 (L) x10 6/uL      MCV 87.3 fL      MCH 30.3 pg      MCHC 34.7 g/dL      RDW 12 %      MPV 8.5 (L) fL      Nucleated RBC 0 /100 WBC           Imaging personally reviewed, including:     Ct Angiogram Head    11/12/2014    1. A stent is  present within the right internal carotid siphon. 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 Additional details are discussed above. 2. Normal examination of the brain.  [name], MD  11/12/2014 10:49 AM     Ct Head W Wo Contrast    11/12/2014    1. A stent is present within the right internal carotid siphon. 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 Additional details are discussed above. 2. Normal examination of the brain.  [name], MD  11/12/2014 10:49 AM     Ct Angiogram Chest    11/12/2014    1. Negative for pulmonary embolism. 2. Small focus of discoid atelectasis in the left lower lobe. 3. There is a large left groin hematoma with active extravasation of contrast material, possibly from the femoral vein.  Urgent findings were reported by telephone to Dr. Jearld Shines.  [name], MD  11/12/2014 8:37 PM     Ct Abdomen Pelvis W Iv Contrast Only    11/13/2014    1. Negative for pulmonary embolism. 2. Small focus of discoid atelectasis in the left lower lobe. 3. There is a large left groin hematoma with active extravasation of contrast material, possibly from the femoral vein.  Urgent findings were reported by telephone to Dr. Jearld Shines.  [name], MD  11/12/2014 8:37 PM     Xr Chest Ap Portable    11/12/2014    Low lung volume with small focus of left basilar atelectasis.  [name], MD  11/12/2014 8:02 PM     [place] Groin Pseudoaneurysm Left W Dopp    11/13/2014     No evidence of left groin pseudoaneurysm or fistula.   7.6 cm left groin hematoma as described above.  [name], MD  11/13/2014 1:41 PM       Safety Checklist  DVT prophylaxis:  CHEST guideline (See page [id number]) Mechanical   Foley:  [place] Rn Foley protocol Not present   IVs:  Peripheral IV   PT/OT: Not needed   Daily CBC & or Chem ordered:  SHM/ABIM guidelines (see #5) Yes, due to clinical and lab instability   Reference for approximate charges of common labs: CBC auto diff - $76  BMP - $99  Mg - $79    Signed by: Gardiner Sleeper, MD    cc:Florencia Reasons, MD

## 2014-11-13 NOTE — Plan of Care (Addendum)
Problem: Safety  Goal: Patient will be free from injury during hospitalization  Outcome: Progressing  Pt AOx4, f/c, MAE, expresses needs. Pt given IV PRN Dilaudid for pain twice and PRN Oxycodone 1 time with effect. Pt given Zofran 1 time for N. Pt complained of chills, ice packs were taken off, pt given warm blanket, no fever noted, pt's temp 97.6. Pt had bilateral hand tremors and shortness of breath, pulse ox at 95% on RA, pt put on 2LNC at 98%. Chills and shortness of breath all resolved, minor tremors, MD made aware. Pt appears jaundiced in face and upper arms, MD made aware, ordered for liver function labs. RT called for PRN neb treatment, but pt stated SOB resolved and did not want PRN neb treatment. Pt on continuous NS at 75cc. Pt voided 200cc, post void bladdar scan of 21cc. Fall precaution in place. Will continue to monitor.

## 2014-11-13 NOTE — UM Notes (Signed)
Admitted under in-pt status; s/p Pipeline placement for RICA cavernous aneurysm developed new right sided bruit, proptosis, chemosis and double vision, and severe headache. CTA shows right CCF. Admitted for management.  62, 113/57, 15  O2 sat 98%  Na 134, CO2 18, h+h 10.9/32.2, glucose 197    Care Day #1:  Admit to NSICU  V/s and neurochecks q 1h  S/p cerebral angiogram, cerebral embolization  Pt started c/o L thigh pain; agitated, HTN with SBP 160-180. Also c/o new onset CP and SOB  Plan per MD: STAT abdomen and pelvis CT showing L abdominal wall hematoma, likely from venous source with active contrast extravasation.  - local pressure, BP control, Pain conrol  - check H/H q 6h.  - now hypotensive - likley from Fentanyl 50 mcg x1; will bolus NS x 1L.  2: R/o PE - STAT chest CTA negative for PE.  3: CCF s/p coil embolization - stable post-op, no new complaints.  - will continue ASA and Plavix for Pipeline (d/c NIR Putman).

## 2014-11-13 NOTE — Progress Notes (Signed)
Neurocritical Care Daily Progress Note    Patient Name: Alexandria Trevino, Alexandria Trevino  Room:F713/F713.01 Date Time: 11/13/2014 4:53 PM   Admit Date: 11/12/2014    Hospital LOS# 1  No chief complaint on file.      Presentation/Hospital course:   59 yo female s/p Pipeline placement for RICA cavernous aneurysm on 10/25/16, developed new right sided bruit, proptosis, chemosis and double vision, and severe headache, who was admitted for an elective coil embolization of Rt CCF evidenced on CTA    24 hr Events: s/p embolization of Rt CCF  Pipeline device in place and patent.Received 1 dose of decadron prior to procedure. Left venous groin sheath removed     ASSESSMENT                               PLAN    Rt CCF s/p coil embolization  S/p pipeline placement for RICA cavernous aneurysm on 10/25/16  Pipe line patent and in place    PMH:  Hypothyroidism  Asthma  Anemia  Arthritis NEURO: Rt CCF s/p coil embolization    -start ASA and plavix  -Goals:   Perfusion goals: SBP 90-150  Na+ goal: 135-140  Pain management    PT/OT/Speech/swallow eval:     CV: Perfusion goals as above, PRN IV hydralazine and labetalol for goal    PULM: extubated after procedure. IS    GI:   GI PPX: famotidine  Nutrition: advance diet as tolerated    RENAL: nai  I/O Goal: ar    ID: afebrile. Check CBC in am    HEME:   SCD's  Pharmacologic ppx: not safe secondary to acute neurologic injury    ENDO/RHEUM: hypothyroidism - resume home Levothyroxine  Glucose: goal 100 -180    Lines/Foley: PIV. No foley  Daily labs/imaging:   Family Meeting:    VITALS, LINES, IMAGING, LABS, MICRO, MEDS, GOALS reviewed and d/w bedside nurse   Code status Full Code  DISPO: Needs ICU      Physical Exam:   Vitals reviewed in EPIC    General Appearance:   Vent: none       Lines: PIV        Foley: none       Surgical sites:     NEURO EXAM:  Mental Status:  Sedation: none                Opens eyes: spont EO      Orientation: x3          Language: fluent          Cranial Nerves  Pupils  OS:  size/reactivity: 4R              OD: size/reactivity: 4R                    EOM(III/IV/VI): intact                               Visual field: no diplopia, VF intact        Corneal(V/VII):         Face(VII): sym  Shoulder shrug/head turn(XI):          Cough/gag(IX/X):                      Uvula (IX/X): upoing  Tongue(XII): ML        Motor:  UE   Deltoid  Bicep  Tricep  WE  WF  Grip    R  5/5         L 5/5           LE   Hip F Hip E Knee F  Knee E  DF  PF    R  5/5         L 5/5         Drift: none  Sensory: intact to light touch   Cerebellar:   Reflexes/Babinki:    Other:   Bedside ultrasound:      Heart: rrr. s1s2 nl, no murmur    Lungs: ctab  Abd: soft, nt/nd, +bs        Ext: no edema  Skin: Lt groin w/o hematoma    Medications:   Scheduled Meds: reviewed     Labs:   reviewed    Rads:   Radiological Imaging personally reviewed:      I have personally reviewed the patient's history and 24 hour interval events, along with vitals, labs, radiology images and ventilator settings. So far today I have spent 35 minutes providing care for this patient excluding teaching and billable procedures, and not overlapping with any other providers.    Sharen Hint, M.D.  #[id number]  Medical Critical Care Service  [facility]  Department of Medicine    Signed by: Rachelle Hora, MD     Date/Time: 11/13/2014 4:53 PM  Neuro Critical Care.   Attending MD [phone] (24hr)  Midlevel Provider [phone] (7a-7p)

## 2014-11-13 NOTE — Plan of Care (Signed)
Problem: Safety  Goal: Patient will be free from injury during hospitalization  Outcome: Progressing  Pt free from injury.  Monitored for hemodynamic instability, and applied pressure via saline bag to right femoral vein.    Problem: Neurological Deficit  Goal: Neurological status is stable or improving  Outcome: Progressing  Pt a/o x 4.  Has sensation in movement in all extremities.

## 2014-11-14 ENCOUNTER — Inpatient Hospital Stay: Payer: BLUE CROSS/BLUE SHIELD

## 2014-11-14 DIAGNOSIS — S301XXA Contusion of abdominal wall, initial encounter: Secondary | ICD-10-CM | POA: Insufficient documentation

## 2014-11-14 DIAGNOSIS — E038 Other specified hypothyroidism: Secondary | ICD-10-CM

## 2014-11-14 DIAGNOSIS — D6489 Other specified anemias: Secondary | ICD-10-CM

## 2014-11-14 LAB — HEPATIC FUNCTION PANEL
ALT: 10 U/L (ref 0–55)
AST (SGOT): 11 U/L (ref 5–34)
Albumin/Globulin Ratio: 1.4 (ref 0.9–2.2)
Albumin: 2.9 g/dL — ABNORMAL LOW (ref 3.5–5.0)
Alkaline Phosphatase: 87 U/L (ref 37–106)
Bilirubin Direct: 0.1 mg/dL (ref 0.0–0.5)
Bilirubin, Total: <0.1 mg/dL — ABNORMAL LOW (ref 0.2–1.2)
Globulin: 2.1 g/dL (ref 2.0–3.6)
Protein, Total: 5 g/dL — ABNORMAL LOW (ref 6.0–8.3)

## 2014-11-14 LAB — BASIC METABOLIC PANEL
BUN: 13 mg/dL (ref 7.0–19.0)
CO2: 22 mEq/L (ref 22–29)
Calcium: 8.4 mg/dL — ABNORMAL LOW (ref 8.5–10.5)
Chloride: 101 mEq/L (ref 100–111)
Creatinine: 0.7 mg/dL (ref 0.6–1.0)
Glucose: 115 mg/dL — ABNORMAL HIGH (ref 70–100)
Potassium: 4.2 mEq/L (ref 3.5–5.1)
Sodium: 131 mEq/L — ABNORMAL LOW (ref 136–145)

## 2014-11-14 LAB — HEMOGLOBIN AND HEMATOCRIT, BLOOD
Hematocrit: 22.7 % — ABNORMAL LOW (ref 37.0–47.0)
Hematocrit: 21.9 % — ABNORMAL LOW (ref 37.0–47.0)
Hgb: 7.7 g/dL — ABNORMAL LOW (ref 12.0–16.0)
Hgb: 7.5 g/dL — ABNORMAL LOW (ref 12.0–16.0)

## 2014-11-14 LAB — CBC
Hematocrit: 23.9 % — ABNORMAL LOW (ref 37.0–47.0)
Hgb: 8.2 g/dL — ABNORMAL LOW (ref 12.0–16.0)
MCH: 30.3 pg (ref 28.0–32.0)
MCHC: 34.3 g/dL (ref 32.0–36.0)
MCV: 88.2 fL (ref 80.0–100.0)
MPV: 8.7 fL — ABNORMAL LOW (ref 9.4–12.3)
Nucleated RBC: 0 /100 WBC (ref 0–1)
Platelets: 341 10*3/uL (ref 140–400)
RBC: 2.71 10*6/uL — ABNORMAL LOW (ref 4.20–5.40)
RDW: 13 % (ref 12–15)
WBC: 10.08 10*3/uL (ref 3.50–10.80)

## 2014-11-14 LAB — GFR: EGFR: >60

## 2014-11-14 MED ORDER — SODIUM CHLORIDE 0.9 % IV SOLN
INTRAVENOUS | Status: AC
Start: 2014-11-14 — End: 2014-11-14

## 2014-11-14 NOTE — Progress Notes (Addendum)
Patient alert and oriented x4, vss, MAE, assessment as per EPIC, medicated with dilaudid and oxycodone for pain in (R) eye and (L)LE, left groin site hard swollen, ecchymotic, distal pulses palpable. Patient with decrease H&H 21.9/7.5, c/o of SOB, discussed patient's status with Dr, Wynetta Fines, MD at bedside to see patient, blood consent formed signed, CT of abdomen and pelvis completed, per MD stable hematoma, no need for transfusion at this time, H&H Q12hrs. family at bedside plan discussed by MD with family, will continue to assess and follow through with plan of care.

## 2014-11-14 NOTE — Progress Notes (Signed)
MEDICINE PROGRESS NOTE    Date Time: 11/14/2014 2:49 PM  Patient Name: Alexandria Trevino  Attending Physician: Isaias Cowman, MD    Assessment:   Principal Problem:    Carotid-cavernous fistula  Active Problems:    Cerebral aneurysm without rupture    Hypothyroidism    Asthma    Arthritis    Anemia    Hematoma, groin, post op  Resolved Problems:    * No resolved hospital problems. 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      1. Left groin hematoma- Trend Hgb q 12 h (minimize artificially lowering Hgb via lab draws)  - Will repeat CT A/P today to ensure stability of hematoma given slight drop in Hgb, discussed with [facility] with recommendations to continue plan of care as outlined  - Denies active bleeding  - [place] did not show evidence of pseudoaneurysm  - Type and screen up to date, consented for blood  - Unable to d/c dual antiplatelet agents given presence of pipeline placement    2. CCF s/p coil embolization  - Discussed extensively with Neuro IR (Dr. Aleen Sells) and pain likely post-procedure pain  - If pain increases in intensity/characteristics, may need repeat CTA to r/o complications of procedure  - Continue dual antiplatelet therapy given presence of pipeline placement  - Pain control with Dilaudid and Oxycodone    3. Hypothyrodism- No acute issues  - Continue Synthroid 88 mcg q day    4. Asthma- No acute issues  - Continue Albuterol prn     Case discussed with: [facility], Neuro IR (Dr. Aleen Sells), Patient, Family, RN    Safety Checklist:     DVT prophylaxis:  CHEST guideline (See page [phone]) Chemical and / or mechanical ppx NOT indicated or contraindicated: Active Bleeding    Foley:  [place] Rn Foley protocol Not present   IVs:  Peripheral IV   PT/OT: Ordered    Daily CBC & or Chem ordered:  SHM/ABIM guidelines (see #5) Yes, due to clinical and lab instability   Reference for approximate charges of common labs: CBC auto diff - $76  BMP - $99  Mg - $79    Lines:     Patient Lines/Drains/Airways Status    Active PICC Line / CVC Line / PIV Line / Drain / Airway / Intraosseous Line / Epidural Line / ART Line / Line / Wound / Pressure Ulcer / NG/OG Tube     Name:   Placement date:   Placement time:   Site:   Days:    Peripheral IV 11/12/14 Right Wrist  11/12/14      Wrist   2    Puncture Site 11/12/14 Femoral Left;Right Groin  11/12/14   1650     1    Puncture Site 11/12/14 Femoral Right Groin  11/12/14        2                 Disposition:     Today's date: 11/14/2014  Admit Date: 11/12/2014 11:56 AM  LOS: 2  Anticipated medical stability for discharge:Red - not tomorrow - estimated month/date: Pending stabilization of hematoma  Service status: Inpatient: risk of morbidity and mortality, risk of progressive disease, risk of  readmission and new onset of end-organ disease that would require in hospital work up  Reason for ongoing hospitalization: Bleeding groin hematoma  Anticipated discharge needs: Pending PT/OT evaluation       Subjective     CC: Carotid-cavernous fistula    Interval History/24 hour events: Slight drop in Hgb from 7.7 to 7.5 this AM.  Patient reports some persistent pain above right eye present after coil embolization.  Reports pain around left groin at site of known hematoma.  No active bleeding noted.  Denies hematemesis, melena, hematochezia.      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  Reports persistent pain above right eye after coil embolization and left groin pain.  Denies active bleeding.   Appears anxious this AM.        Review of Systems:     Gen: Anxious  CV: Denies chest pain  Resp: Denies shortness of breath  Abd: Reports L groin pain  Neuro: Reports pain above R eye     Physical Exam:     VITAL SIGNS PHYSICAL EXAM   Temp:  [97 F (36.1 C)-98.4 F (36.9 C)] 97 F (36.1 C)  Heart Rate:  [62-78] 78  Resp Rate:  [14-18] 18  BP: (85-124)/(51-63) 118/62 mmHg    Intake/Output Summary (Last 24 hours) at 11/14/14 1449  Last data filed at 11/13/14 2255   Gross per 24 hour   Intake    200 ml   Output    460 ml   Net   -260 ml    Physical Exam    General: awake, alert, oriented x 3; no acute distress, appears anxious   HEENT: perrla, eomi, sclera anicteric oropharynx clear without lesions, mucous membranes moist  Neck: supple, no lymphadenopathy, no thyromegaly, no JVD, no carotid bruits  Cardiovascular: regular rate and rhythm, no murmurs, rubs or gallops  Lungs: clear to auscultation bilaterally, without wheezing, rhonchi, or rales  Abdomen: soft, non-tender, non-distended; no palpable masses, no hepatosplenomegaly, normoactive bowel sounds, no rebound or guarding  Extremities: no clubbing, cyanosis, left groin hematoma extending to upper thigh  Neuro: cranial nerves 2-12 grossly intact, strength 5/5 in upper and lower extremities, sensation intact       Meds:     Medications were reviewed:    Labs:     Labs (last 72 hours):      Recent Labs  Lab 11/14/14  1351 11/14/14  0533 11/14/14  0056  11/13/14  0430   WBC  --   --  10.08  --  10.55   HGB 7.5* 7.7* 8.2* More results in Results Review 9.6*   HEMATOCRIT 21.9* 22.7* 23.9* More results in Results Review 27.6*   PLATELETS  --   --  341  --  390   More results in Results Review = values in this interval not displayed.      Recent Labs  Lab 11/13/14  1006   PT 14.8   PT INR 1.2*   PTT 22*      Recent Labs  Lab 11/14/14  0533 11/14/14  0057 11/13/14  0430   SODIUM  --  131* 135*   POTASSIUM  --  4.2 4.5   CHLORIDE  --  101 107   CO2  --  22 18*   BUN  --   13.0 12.0   CREATININE  --  0.7 0.7   CALCIUM  --  8.4* 8.2*   ALBUMIN 2.9*  --   --    PROTEIN, TOTAL 5.0*  --   --    BILIRUBIN, TOTAL <0.1*  --   --    ALKALINE PHOSPHATASE 87  --   --    ALT 10  --   --    AST (SGOT) 11  --   --    GLUCOSE  --  115* 162*                   Microbiology, reviewed and are significant for:  Microbiology Results     Procedure Component Value Units Date/Time    MRSA culture [[id number]] Collected:  11/13/14 0430    Specimen Information:  Body Fluid from Nasal/Throat ASC Admission Updated:  11/14/14 4098    Narrative:      ORDER#: [id number]                                    ORDERED BY: Joya San  SOURCE: Nares and Throat                             COLLECTED:  11/13/14 04:30  ANTIBIOTICS AT COLL.:                                RECEIVED :  11/13/14 09:36  Culture MRSA Surveillance                  FINAL       11/14/14 09:22  11/14/14   Negative for Methicillin Resistant Staph aureus from Nares and             Negative for Methicillin Resistant Staph aureus from Throat              Imaging, reviewed and are significant for:  Ct Angiogram Head    11/12/2014    1. A stent is present within the right internal carotid siphon. 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 Additional details are discussed above. 2. Normal examination of the brain.  [name], MD  11/12/2014 10:49 AM     Ct Head W Wo Contrast    11/12/2014    1. A stent is present within the right internal carotid siphon. 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 Additional details are discussed above. 2. Normal examination of the brain.  [name], MD  11/12/2014 10:49 AM     Ct Angiogram Chest    11/12/2014    1. Negative for pulmonary embolism. 2. Small focus of discoid atelectasis in the left lower lobe. 3. There is a large left groin hematoma with active extravasation of contrast material, possibly from the femoral vein.  Urgent findings were reported by telephone to Dr. Jearld Shines.  [name], MD  11/12/2014  8:37 PM     Ct Abdomen Pelvis W Iv Contrast Only    11/13/2014    1. Negative for pulmonary embolism. 2. Small focus of discoid atelectasis in the left lower lobe. 3. There is a large left groin hematoma with active extravasation of contrast material, possibly from the femoral vein.  Urgent findings were reported  by telephone to Dr. Jearld Shines.  [name], MD  11/12/2014 8:37 PM     Xr Chest Ap Portable    11/12/2014    Low lung volume with small focus of left basilar atelectasis.  [name], MD  11/12/2014 8:02 PM     [place] Groin Pseudoaneurysm Left W Dopp    11/13/2014     No evidence of left groin pseudoaneurysm or fistula.   7.6 cm left groin hematoma as described above.  [name], MD  11/13/2014 1:41 PM     Neuro Embolization    11/14/2014     Right cavernous carotid fistula related to rupture of previously treated right internal carotid artery cavernous aneurysm occluded using a transvenous approach and DCS ORBIT coils.  [name], MD  11/14/2014 2:32 PM           Signed by: Isaias Cowman, MD

## 2014-11-14 NOTE — Plan of Care (Signed)
Problem: Safety  Goal: Patient will be free from injury during hospitalization  Outcome: Progressing    Problem: Pain  Goal: Patient's pain/discomfort is manageable  Outcome: Progressing    Problem: Potential for Compromised Skin Integrity  Goal: Skin integrity is maintained or improved  Assess and monitor skin integrity. Identify patients at risk for skin breakdown on admission and per policy. Collaborate with interdisciplinary team and initiate plans and interventions as needed.   Outcome: Progressing    Problem: Tissue integrity  Goal: Damaged tissue is healing and protected  Outcome: Progressing    Problem: Moderate/High Fall Risk Score >5  Goal: Patient will remain free of falls  Outcome: Progressing    Problem: Neurological Deficit  Goal: Neurological status is stable or improving  Outcome: Progressing

## 2014-11-15 ENCOUNTER — Inpatient Hospital Stay: Payer: BLUE CROSS/BLUE SHIELD

## 2014-11-15 DIAGNOSIS — D6489 Other specified anemias: Secondary | ICD-10-CM | POA: Insufficient documentation

## 2014-11-15 DIAGNOSIS — E038 Other specified hypothyroidism: Secondary | ICD-10-CM | POA: Insufficient documentation

## 2014-11-15 DIAGNOSIS — J4520 Mild intermittent asthma, uncomplicated: Secondary | ICD-10-CM | POA: Insufficient documentation

## 2014-11-15 DIAGNOSIS — E871 Hypo-osmolality and hyponatremia: Secondary | ICD-10-CM | POA: Insufficient documentation

## 2014-11-15 LAB — CBC
Hematocrit: 20.9 % — ABNORMAL LOW (ref 37.0–47.0)
Hgb: 7.3 g/dL — ABNORMAL LOW (ref 12.0–16.0)
MCH: 30.5 pg (ref 28.0–32.0)
MCHC: 34.9 g/dL (ref 32.0–36.0)
MCV: 87.4 fL (ref 80.0–100.0)
MPV: 8.3 fL — ABNORMAL LOW (ref 9.4–12.3)
Nucleated RBC: 0 /100 WBC (ref 0–1)
Platelets: 273 10*3/uL (ref 140–400)
RBC: 2.39 10*6/uL — ABNORMAL LOW (ref 4.20–5.40)
RDW: 12 % (ref 12–15)
WBC: 7.22 10*3/uL (ref 3.50–10.80)

## 2014-11-15 LAB — SODIUM, URINE, RANDOM: Urine Sodium Random: 27 mEq/L

## 2014-11-15 LAB — BASIC METABOLIC PANEL
BUN: 6 mg/dL — ABNORMAL LOW (ref 7.0–19.0)
CO2: 23 mEq/L (ref 22–29)
Calcium: 8.1 mg/dL — ABNORMAL LOW (ref 8.5–10.5)
Chloride: 98 mEq/L — ABNORMAL LOW (ref 100–111)
Creatinine: 0.6 mg/dL (ref 0.6–1.0)
Glucose: 114 mg/dL — ABNORMAL HIGH (ref 70–100)
Potassium: 3.8 mEq/L (ref 3.5–5.1)
Sodium: 127 mEq/L — ABNORMAL LOW (ref 136–145)

## 2014-11-15 LAB — OSMOLALITY, URINE: Urine Osmolality: 106 mosm/kg — ABNORMAL LOW (ref 300–1094)

## 2014-11-15 LAB — SODIUM
Sodium: 126 mEq/L — ABNORMAL LOW (ref 136–145)
Sodium: 126 mEq/L — ABNORMAL LOW (ref 136–145)
Sodium: 134 mEq/L — ABNORMAL LOW (ref 136–145)

## 2014-11-15 LAB — HEMOGLOBIN AND HEMATOCRIT, BLOOD
Hematocrit: 21.9 % — ABNORMAL LOW (ref 37.0–47.0)
Hematocrit: 22 % — ABNORMAL LOW (ref 37.0–47.0)
Hgb: 7.6 g/dL — ABNORMAL LOW (ref 12.0–16.0)
Hgb: 7.7 g/dL — ABNORMAL LOW (ref 12.0–16.0)

## 2014-11-15 LAB — CORTISOL: Cortisol: 16.5 ug/dL

## 2014-11-15 LAB — GFR: EGFR: >60

## 2014-11-15 LAB — OSMOLALITY: Osmolality: 257 mosm/kg — ABNORMAL LOW (ref 280–300)

## 2014-11-15 LAB — TSH: TSH: 2.46 u[IU]/mL (ref 0.35–4.94)

## 2014-11-15 MED ORDER — SODIUM CHLORIDE 3 % IV SOLN
INTRAVENOUS | Status: DC
Start: 2014-11-15 — End: 2014-11-15
  Administered 2014-11-15: 20 mL/h via INTRAVENOUS
  Filled 2014-11-15: qty 500

## 2014-11-15 MED ORDER — CALCIUM CARBONATE ANTACID 500 MG PO CHEW
500.0000 mg | CHEWABLE_TABLET | Freq: Four times a day (QID) | ORAL | Status: DC | PRN
Start: 2014-11-15 — End: 2014-11-19
  Administered 2014-11-15: 500 mg via ORAL
  Filled 2014-11-15: qty 1

## 2014-11-15 MED ORDER — DEXTROSE 5 % IV SOLN
500.0000 mg | Freq: Three times a day (TID) | INTRAVENOUS | Status: AC
Start: 2014-11-15 — End: 2014-11-16
  Administered 2014-11-15 – 2014-11-16 (×5): 500 mg via INTRAVENOUS
  Filled 2014-11-15 (×5): qty 5

## 2014-11-15 MED ORDER — DEXAMETHASONE 1 MG PO TABS
1.0000 mg | ORAL_TABLET | Freq: Two times a day (BID) | ORAL | Status: DC
Start: 2014-11-15 — End: 2014-11-19
  Administered 2014-11-15 – 2014-11-19 (×9): 1 mg via ORAL
  Filled 2014-11-15 (×9): qty 1

## 2014-11-15 MED ORDER — SODIUM CHLORIDE 3 % IV SOLN
INTRAVENOUS | Status: DC
Start: 2014-11-15 — End: 2014-11-16
  Administered 2014-11-15: 30 mL/h via INTRAVENOUS
  Administered 2014-11-16: 20 mL/h via INTRAVENOUS
  Filled 2014-11-15: qty 500

## 2014-11-15 NOTE — Progress Notes (Signed)
MEDICINE PROGRESS NOTE    Date Time: 11/15/2014 3:34 PM  Patient Name: Alexandria Trevino  Attending Physician: Isaias Cowman, MD    Assessment:   Principal Problem:    Carotid-cavernous fistula  Active Problems:    Cerebral aneurysm without rupture    Hypothyroidism    Asthma    Arthritis    Anemia    Hematoma, groin, post op    Groin hematoma, initial encounter  Resolved Problems:    * No resolved hospital problems. 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      1. Left groin hematoma- Trend Hgb q 12 h (minimize artificially lowering Hgb via lab draws)  - Repeat CT shows slightly larger hematoma however coalescing and stable  - No active bleeding noted  - [place] did not show evidence of pseudoaneurysm  - Type and screen up to date, consented for blood, has not required transfusions  - Unable to d/c dual antiplatelet agents given presence of pipeline placement  - May need repeat CT A/P and [place] of left groin to evaluate stability again     2. CCF s/p coil embolization  - Discussed new symptoms with Neuro IR (Dr. Aleen Sells) and pain likely post-procedure pain  - Repeat CT Head does not show any acute bleed, defer further imaging at this time per Neuro IR  - Continue dual antiplatelet therapy given presence of pipeline placement  - Pain control with Dilaudid and Oxycodone  - Start Decadron 2 mg q day per Neuro IR recs for inflammation control     3. Hyponatremia- Currently 126 and acute in nature given Na of 135 on 11/13/14  - Patient asymptomatic at this time and volume status likely euvolemic  - Will check serum osm, urine osm, urine Na  - Patient already on Synthroid for hypothyroidism, check cortisol level  - Given acute hyponatremia will start hypertonic saline,  free water restrict to 1 L/day  - Monitor Na q 6 h to avoid overcorrection    4. Hypothyrodism- No acute issues  - Continue Synthroid 88 mcg q day    5. Asthma- No acute issues  - Continue Albuterol prn     Case discussed with: Neuro IR (Dr. Aleen Sells), Patient, Family, RN    Safety Checklist:     DVT prophylaxis:  CHEST guideline (See page [phone]) Chemical and / or mechanical ppx NOT indicated or contraindicated: Active Bleeding    Foley:   Rn Foley protocol Not present   IVs:  Peripheral IV   PT/OT: Ordered   Daily CBC & or Chem ordered:  SHM/ABIM guidelines (see #5) Yes, due to clinical and lab instability   Reference for approximate charges of common labs: CBC auto diff - $76  BMP - $99  Mg - $79    Lines:     Patient Lines/Drains/Airways Status    Active PICC Line / CVC Line / PIV Line / Drain / Airway / Intraosseous Line / Epidural Line / ART Line / Line / Wound / Pressure Ulcer / NG/OG Tube     Name:   Placement date:   Placement time:   Site:   Days:    Peripheral IV 11/12/14 Right Wrist  11/12/14  Wrist   2    Puncture Site 11/12/14 Femoral Left;Right Groin  11/12/14   1650     1    Puncture Site 11/12/14 Femoral Right Groin  11/12/14        2                 Disposition:     Today's date: 11/15/2014  Admit Date: 11/12/2014 11:56 AM  LOS: 3  Anticipated medical stability for discharge:Red - not tomorrow - estimated month/date: Pending stabilization of hematoma, hyponatremia  Service status: Inpatient: risk of morbidity and mortality, risk of progressive disease, risk of readmission and new onset of end-organ disease that would require in hospital work up  Reason for ongoing hospitalization: Bleeding groin hematoma, acute hyponatremia  Anticipated discharge needs: Pending PT/OT evaluation       Subjective     CC: Carotid-cavernous fistula    Interval History/24 hour events: Slight drop in Hgb to 7.3.  Patient denies active bleeding.  Reports pain from groin is less.  Reports worse pain above right eye  with blurry vision.  States vision is worse compared to yesterday.  Na continues to be acutely low at 126.  Patient asymptomatic from hyponatremia at this time.      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  Reports persistent pain above right eye after coil embolization and left groin pain that is improving.  Asymptomatic from hyponatremia.  Denies active bleeding.      Review of Systems:     Gen: Anxious  CV: Denies chest pain  Resp: Denies shortness of breath  Abd: Reports L groin pain  Neuro: Reports pain above R eye     Physical Exam:     VITAL SIGNS PHYSICAL EXAM   Temp:  [97 F (36.1 C)-98.2 F (36.8 C)] 97 F (36.1 C)  Heart Rate:  [72-77] 72  Resp Rate:  [18-20] 18  BP: (130-152)/(62-76) 140/75 mmHg    Intake/Output Summary (Last 24 hours) at 11/15/14 1534  Last data filed at 11/14/14 2305   Gross per 24 hour   Intake      0 ml   Output   1400 ml   Net  -1400 ml    Physical Exam  General: awake, alert, oriented x 3; no acute distress, appears anxious   HEENT: perrla, eomi, sclera anicteric oropharynx clear without lesions, mucous membranes moist  Neck: supple, no lymphadenopathy, no thyromegaly, no JVD, no carotid bruits  Cardiovascular: regular rate and rhythm, no murmurs, rubs or gallops  Lungs: clear to auscultation bilaterally, without wheezing, rhonchi, or rales  Abdomen: soft, non-tender, non-distended; no palpable masses, no hepatosplenomegaly, normoactive bowel sounds, no rebound or guarding  Extremities: no clubbing, cyanosis, left groin hematoma extending to upper thigh (line of demarcation appears to be smaller today)  Neuro: cranial nerves 2-12 grossly intact, strength 5/5 in upper and lower extremities, sensation intact, speech  fluent, answers questions appropriately       Meds:     Medications were reviewed:    Labs:     Labs (last 72 hours):      Recent Labs  Lab 11/15/14  0323 11/14/14  1351  11/14/14  0056   WBC 7.22  --   --  10.08   HGB 7.3* 7.5* More results in Results Review 8.2*   HEMATOCRIT 20.9* 21.9* More results in Results Review 23.9*   PLATELETS 273  --   --  341   More results in Results Review = values in this interval not displayed.      Recent Labs  Lab 11/13/14  1006   PT 14.8   PT INR 1.2*   PTT 22*      Recent Labs  Lab 11/15/14  1031 11/15/14  0323 11/14/14  0533 11/14/14  0057   SODIUM 126* 127*  --  131*   POTASSIUM  --  3.8  --  4.2   CHLORIDE  --  98*  --  101   CO2  --  23  --  22   BUN  --  6.0*  --  13.0   CREATININE  --  0.6  --  0.7   CALCIUM  --  8.1*  --  8.4*   ALBUMIN  --   --  2.9*  --    PROTEIN, TOTAL  --   --  5.0*  --    BILIRUBIN, TOTAL  --   --  <0.1*  --    ALKALINE PHOSPHATASE  --   --  87  --    ALT  --   --  10  --    AST (SGOT)  --   --  11  --    GLUCOSE  --  114*  --  115*                   Microbiology, reviewed and are significant for:  Microbiology Results     Procedure Component Value Units Date/Time    MRSA culture [[id number]] Collected:  11/13/14 0430    Specimen Information:  Body Fluid from Nasal/Throat ASC Admission Updated:  11/14/14 4098    Narrative:      ORDER#: [id number]                                    ORDERED BY: Joya San  SOURCE: Nares and Throat                             COLLECTED:  11/13/14 04:30  ANTIBIOTICS AT COLL.:                                RECEIVED :  11/13/14 09:36  Culture MRSA Surveillance                  FINAL       11/14/14 09:22  11/14/14   Negative for Methicillin Resistant Staph aureus from Nares and             Negative for Methicillin Resistant Staph aureus from Throat              Imaging, reviewed and are significant for:  Ct Abdomen Pelvis Wo Iv/ Wo Po Cont    11/14/2014     Redemonstration of a large left groin hematoma, which appears  slightly better organized compared to prior study as above. Active extravasation or relationship to adjacent vasculature cannot be evaluated due to lack of IV contrast administration.  [name], MD  11/14/2014 3:03 PM     Ct  Angiogram Head    11/12/2014    1. A stent is present within the right internal carotid siphon. 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 Additional details are discussed above. 2. Normal examination of the brain.  [name], MD  11/12/2014 10:49 AM     Ct Head Wo Contrast    11/15/2014    Embolization coils and stent are seen in the region of the right cavernous sinus. Evaluation of the adjacent brain parenchyma is suboptimal due to marked streak artifact.  Within limitation, no acute abnormality.  [name], MD  11/15/2014 2:30 AM     Ct Head W Wo Contrast    11/12/2014    1. A stent is present within the right internal carotid siphon. 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 Additional details are discussed above. 2. Normal examination of the brain.  [name], MD  11/12/2014 10:49 AM     Ct Angiogram Chest    11/12/2014    1. Negative for pulmonary embolism. 2. Small focus of discoid atelectasis in the left lower lobe. 3. There is a large left groin hematoma with active extravasation of contrast material, possibly from the femoral vein.  Urgent findings were reported by telephone to Dr. Jearld Shines.  [name], MD  11/12/2014 8:37 PM     Ct Abdomen Pelvis W Iv Contrast Only    11/13/2014    1. Negative for pulmonary embolism. 2. Small focus of discoid atelectasis in the left lower lobe. 3. There is a large left groin hematoma with active extravasation of contrast material, possibly from the femoral vein.  Urgent findings were reported by telephone to Dr. Jearld Shines.  [name], MD  11/12/2014 8:37 PM     Xr Chest Ap Portable    11/12/2014    Low lung volume with small focus of left basilar atelectasis.  [name], MD  11/12/2014 8:02 PM     [place] Groin Pseudoaneurysm Left W  Dopp    11/13/2014     No evidence of left groin pseudoaneurysm or fistula.   7.6 cm left groin hematoma as described above.  [name], MD  11/13/2014 1:41 PM     Neuro Embolization    11/14/2014     Right cavernous carotid fistula related to rupture of previously treated right internal carotid artery cavernous aneurysm occluded using a transvenous approach and DCS ORBIT coils.  [name], MD  11/14/2014 2:32 PM           Signed by: Isaias Cowman, MD

## 2014-11-15 NOTE — Significant Event (Signed)
I was called by RN earlier about patient having new onset diplopia. Pt is 59 yr old with hx of pipeline placement for RICA cavernous aneurysm on 10/26/14 and subsequently developed new right sided bruit, proptosis, chemosis and diplopia, severe headache and underwent CTA head on 8/4 revealing right Cavernous Carotid Fistula.  Pt underwent elective coil embolization of CCF.  Procedure was complicated by post op left groin hematoma.  Post procedure patient's diplopia resolved.  She continued to have persistent HA. She has been having increase in right eye pressure.  Tonight patient states that the diplopia returned suddenly and has been persistent.  Stat CT head wo was obtained - no acute abnormality was noted.  On exam patient moves all 4 ext and is equal, PERRLA, EOMI (pt had previous restricted lateral gaze but currently not present).  I spoke with Dr. Aleen Sells about the diplopia and CT head.  No further studies recommended at this time.      Will follow    Hal Morales. Lysle Morales, DO  [facility]ist

## 2014-11-15 NOTE — Progress Notes (Signed)
INR    C/o increased orbital pain and diplopia last night.  Persists today.    PE:    Vss    Alert and oriented, normal speech.  Mild right ptosis, no chemosis.  Mild restriction to right lateral gaze, pupils reactive and equal  No bruits.    Left groin with hematoma improved.      Impression:    Diplopia and retro-orbital pain expected as the Right ICA Cav aneurysm thrombosis. No sign of recurrent CCF    Recommendation:  1. Increase activity - PT eval.  2. Start Decadron for inflammation relief.  3. Consider Tegretol or Dilantin if pain does not respond.      Amedeo Kinsman MD

## 2014-11-15 NOTE — Plan of Care (Addendum)
Problem: Safety  Goal: Patient will be free from injury during hospitalization  Outcome: Progressing  Pt safety maintained; fall mats down, call light within reach. Pt able to use call bell appropriately.     Problem: Pain  Goal: Patient's pain/discomfort is manageable  Outcome: Not Progressing  Pain at posterior (lower) head, described as aching/cramping , rating 6-8/10. Increased pain since day shift, MD notified.   Pain of L groin/leg at location of hematoma. Increased pain with movement and to touch.    Problem: Inadequate Tissue Perfusion  Goal: Adequate tissue perfusion will be maintained  Outcome: Progressing  Distal and proximal pulses palpated, cap refill <3sec. VVS. Legs elevated, SCDs in place. Pt turns self. Puncture wound at L groin clean, dry, OTA.   Pt denies numbness/tingling of extremities. MAE with limited movement of L leg due to hematoma.     Comments:   Pt A&Ox4. Pleasant. Complains of headache (posterior,lower), and L leg pain at site of hematoma. Dilaudid IV given. Headache increased and persists, MD notified. Nausea due to dilaudid, zofran given x1 with good result. Pt prefers ginger ale to drink before and after dilaudid admin.   Peripheral IV 18G R AC available for use for CTA head.     0300: Post- Head CT, pt reports decreased headache, rating it 5-6/10. Still experiencing double vision when both eyes open, MD updated.    Na 127, MD aware, 3% NaCl started per order. Na Check Q6.

## 2014-11-15 NOTE — Plan of Care (Signed)
Problem: Safety  Goal: Patient will be free from injury during hospitalization  Outcome: Progressing  Status: Pt instructed to call for assistance. Verbalized understanding. Call bell and bedside table within reach and pt knows how to use. Fall mat in place and bed alarm on. Husband at the bedside.  Plan: Will continue to monitor with hourly rounding and treat as needed.    Problem: Pain  Goal: Patient's pain/discomfort is manageable  Outcome: Progressing  Dilaudid given for eye pain and headache with relief.    Problem: Neurological Deficit  Goal: Neurological status is stable or improving  Outcome: Progressing  Pt complaining of diplopia. MD aware. CT done yesterday and was negative.    Comments:   Pt alert and oriented and able to make needs known. MAE. Pain med given as needed with relief. VSS. Pt remains on q6 sodium check. 3% NACL infusing as ordered. Hematoma to left groin improving. Pt complaining of increased right eye drooping and pain. Dr Wynetta Fines notified and was at the bedside to see pt. Symptoms improved with pain meds. Pt complaining of indigestion. Order  Received for Tums. NAD. Will continue to monitor with hourly rounding.

## 2014-11-16 LAB — CBC
Hematocrit: 22.7 % — ABNORMAL LOW (ref 37.0–47.0)
Hgb: 7.9 g/dL — ABNORMAL LOW (ref 12.0–16.0)
MCH: 30.3 pg (ref 28.0–32.0)
MCHC: 34.8 g/dL (ref 32.0–36.0)
MCV: 87 fL (ref 80.0–100.0)
MPV: 8.5 fL — ABNORMAL LOW (ref 9.4–12.3)
Nucleated RBC: 0 /100 WBC (ref 0–1)
Platelets: 319 10*3/uL (ref 140–400)
RBC: 2.61 10*6/uL — ABNORMAL LOW (ref 4.20–5.40)
RDW: 13 % (ref 12–15)
WBC: 6.45 10*3/uL (ref 3.50–10.80)

## 2014-11-16 LAB — CBC AND DIFFERENTIAL
Basophils Absolute Automated: 0.03 10*3/uL (ref 0.00–0.20)
Basophils Automated: 0 %
Eosinophils Absolute Automated: 0.05 10*3/uL (ref 0.00–0.70)
Eosinophils Automated: 1 %
Hematocrit: 22.2 % — ABNORMAL LOW (ref 37.0–47.0)
Hgb: 7.6 g/dL — ABNORMAL LOW (ref 12.0–16.0)
Immature Granulocytes Absolute: 0.03 10*3/uL
Immature Granulocytes: 1 %
Lymphocytes Absolute Automated: 1.38 10*3/uL (ref 0.50–4.40)
Lymphocytes Automated: 21 %
MCH: 30.3 pg (ref 28.0–32.0)
MCHC: 34.2 g/dL (ref 32.0–36.0)
MCV: 88.4 fL (ref 80.0–100.0)
MPV: 8.3 fL — ABNORMAL LOW (ref 9.4–12.3)
Monocytes Absolute Automated: 0.58 10*3/uL (ref 0.00–1.20)
Monocytes: 9 %
Neutrophils Absolute: 4.59 10*3/uL (ref 1.80–8.10)
Neutrophils: 69 %
Nucleated RBC: 0 /100 WBC (ref 0–1)
Platelets: 377 10*3/uL (ref 140–400)
RBC: 2.51 10*6/uL — ABNORMAL LOW (ref 4.20–5.40)
RDW: 13 % (ref 12–15)
WBC: 6.66 10*3/uL (ref 3.50–10.80)

## 2014-11-16 LAB — BASIC METABOLIC PANEL
BUN: 8 mg/dL (ref 7.0–19.0)
BUN: 10 mg/dL (ref 7.0–19.0)
CO2: 27 mEq/L (ref 22–29)
CO2: 24 mEq/L (ref 22–29)
Calcium: 8.8 mg/dL (ref 8.5–10.5)
Calcium: 8.8 mg/dL (ref 8.5–10.5)
Chloride: 102 mEq/L (ref 100–111)
Chloride: 102 mEq/L (ref 100–111)
Creatinine: 0.7 mg/dL (ref 0.6–1.0)
Creatinine: 0.7 mg/dL (ref 0.6–1.0)
Glucose: 104 mg/dL — ABNORMAL HIGH (ref 70–100)
Glucose: 101 mg/dL — ABNORMAL HIGH (ref 70–100)
Potassium: 4.4 mEq/L (ref 3.5–5.1)
Potassium: 3.9 mEq/L (ref 3.5–5.1)
Sodium: 135 mEq/L — ABNORMAL LOW (ref 136–145)
Sodium: 136 mEq/L (ref 136–145)

## 2014-11-16 LAB — TYPE AND SCREEN
AB Screen Gel: NEGATIVE
ABO Rh: O POS

## 2014-11-16 LAB — GFR
EGFR: >60
EGFR: >60

## 2014-11-16 LAB — SODIUM
Sodium: 133 mEq/L — ABNORMAL LOW (ref 136–145)
Sodium: 134 mEq/L — ABNORMAL LOW (ref 136–145)

## 2014-11-16 NOTE — Progress Notes (Signed)
11/13/14 0947   Patient Type   Within 30 Days of Previous Admission? Yes   Medicare focused diagnosis patient? Not a Medicare focused diagnosis patient   Bundle patient? Not a bundle patient   Healthcare Decisions   Interviewed: Patient   Orientation/Decision Making Abilities of Patient Alert and Oriented x3, able to make decisions   Advance Directive Patient does not have advance directive   Advance Directive not in Chart Copy requested from family/decision maker   Healthcare Agent Appointed No   Additional Emergency Contacts? Maisie Hauser [phone] and cell [phone]   Prior to admission   Prior level of function Independent with ADLs   Type of Residence Private residence   Home Layout Multi-level   Have running water, electricity, heat, etc? Yes   Living Arrangements Spouse/significant other   DME Currently at [employer] wheel walker   Home Care/Community Services None   Adult Protective Services (APS) involved? No   Discharge Planning   Support Systems Spouse/significant other   Patient expects to be discharged to: (home)   Anticipated [place] plan discussed with: Same as interviewed   [place] discussion contact information: Dion Saucier, MSW   Follow up appointment scheduled? Yes   Follow up appointment scheduled with: PCP   Mode of transportation: Private car (family member)   Consults/Providers   PT Evaluation Needed 1   OT Evalulation Needed 1   SLP Evaluation Needed 2

## 2014-11-16 NOTE — Progress Notes (Signed)
MEDICINE PROGRESS NOTE    Date Time: 11/16/2014 5:10 PM  Patient Name: Alexandria Trevino  Attending Physician: Isaias Cowman, MD    Assessment:   Principal Problem:    Carotid-cavernous fistula  Active Problems:    Cerebral aneurysm without rupture    Hypothyroidism    Asthma    Arthritis    Anemia    Hematoma, groin, post op    Groin hematoma, initial encounter    Anemia due to other cause    Mild intermittent asthma without complication    Other specified hypothyroidism    Acute hyponatremia  Resolved Problems:    * No resolved hospital problems. 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      1. Left groin hematoma- Hgb now appears stable, trend q day, goal Hgb > 7   - Repeat CT shows slightly larger hematoma however coalescing and stable  - No active bleeding noted  - [place] did not show evidence of pseudoaneurysm  - Type and screen up to date, consented for blood, has not required transfusions  - Unable to d/c dual antiplatelet agents given presence of pipeline placement    2. CCF s/p coil embolization  - Discussed with Neuro IR (Dr. Aleen Sells) and pain likely post-procedure pain  - Repeat CT Head does not show any acute bleed, defer further imaging at this time per Neuro IR  - Continue dual antiplatelet therapy given presence of pipeline placement  - Pain control with Dilaudid and Oxycodone  - Start Decadron 1 mg BID per Neuro IR recs for inflammation control     3. Hyponatremia- Most recent value of 133, Na had acutely dropped from 135 to 126 in less than 48 hours previously   - Was previously on hypertonic saline given acute drop however now discontinued    - Correction goal max 8-10 meq in first 24 hours to avoid rapid overcorrection   - Fluid restrict to  1.5 L/day  - Labs consistent with SIADH, low serum osm, urine osm > 100, and urine Na > 25  - Trend q 6 h Na to avoid rapid changes  - TSH and cortisol wnl       4. Hypothyrodism- No acute issues and TSH wnl  - Continue Synthroid 88 mcg q day    5. Asthma- No acute issues  - Continue Albuterol prn     Case discussed with: Neuro IR (Dr. Aleen Sells), Patient, Family, RN    Safety Checklist:     DVT prophylaxis:  CHEST guideline (See page [phone]) Chemical and / or mechanical ppx NOT indicated or contraindicated: Active Bleeding    Foley:  [place] Rn Foley protocol Not present   IVs:  Peripheral IV   PT/OT: Ordered   Daily CBC & or Chem ordered:  SHM/ABIM guidelines (see #5) Yes, due to clinical and lab instability   Reference for approximate charges of common labs: CBC auto diff - $76  BMP - $99  Mg - $79    Lines:     Patient Lines/Drains/Airways Status    Active PICC Line / CVC Line / PIV Line / Drain / Airway / Intraosseous Line / Epidural Line / ART Line / Line / Wound / Pressure Ulcer /  NG/OG Tube     Name:   Placement date:   Placement time:   Site:   Days:    Peripheral IV 11/12/14 Right Wrist  11/12/14      Wrist   2    Puncture Site 11/12/14 Femoral Left;Right Groin  11/12/14   1650     1    Puncture Site 11/12/14 Femoral Right Groin  11/12/14        2                 Disposition:     Today's date: 11/16/2014  Admit Date: 11/12/2014 11:56 AM  LOS: 4  Anticipated medical stability for discharge:Red - not tomorrow - estimated month/date: Pending stabilization of hematoma, hyponatremia  Service status: Inpatient: risk of morbidity and mortality, risk of progressive disease, risk of readmission and new onset of end-organ disease that would require in hospital work up  Reason for ongoing hospitalization: Bleeding groin hematoma, acute hyponatremia  Anticipated discharge needs: Pending PT/OT evaluation       Subjective     CC: Carotid-cavernous fistula    Interval History/24 hour events: States she feels better this AM.   Feels more energetic.  Wants to work with PT/OT.  Denies acute complaints.  Reports hematoma appears to be stable.  Line of demarcation appears stable.      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  States she is doing better.  Reports she wants to work with PT/OT.  Reports pain above right eye improving.  Denies chest pain, abdominal pain, or shortness of breath.      Review of Systems:     Gen: Denies fevers, chills  CV: Denies chest pain  Resp: Denies shortness of breath  Abd: Reports L groin pain  Neuro: Reports pain above R eye     Physical Exam:     VITAL SIGNS PHYSICAL EXAM   Temp:  [96.7 F (35.9 C)-99.8 F (37.7 C)] 98.3 F (36.8 C)  Heart Rate:  [81-93] 81  Resp Rate:  [18] 18  BP: (111-145)/(68-87) 123/68 mmHg    Intake/Output Summary (Last 24 hours) at 11/16/14 1710  Last data filed at 11/15/14 1800   Gross per 24 hour   Intake    800 ml   Output   2000 ml   Net  -1200 ml    Physical Exam  General: awake, alert, oriented x 3; no acute distress, appears anxious   HEENT: perrla, eomi, sclera anicteric oropharynx clear without lesions, mucous membranes moist  Neck: supple, no lymphadenopathy, no thyromegaly, no JVD, no carotid bruits  Cardiovascular: regular rate and rhythm, no murmurs, rubs or gallops  Lungs: clear to auscultation bilaterally, without wheezing, rhonchi, or rales  Abdomen: soft, non-tender, non-distended; no palpable masses, no hepatosplenomegaly, normoactive bowel sounds, no rebound or guarding  Extremities: no clubbing, cyanosis, left groin hematoma extending to upper thigh (line of demarcation appears to be smaller compared to yesterday)  Neuro: cranial nerves 2-12 grossly intact, strength 5/5 in upper and lower extremities,  sensation intact, speech fluent, answers questions appropriately       Meds:     Medications were reviewed:    Labs:     Labs (last 72 hours):  Recent Labs  Lab 11/16/14  0418 11/15/14  2213  11/15/14  0323   WBC 6.45  --   --  7.22   HGB 7.9* 7.7* More results in Results Review 7.3*   HEMATOCRIT 22.7* 22.0* More results in Results Review 20.9*   PLATELETS 319  --   --  273   More results in Results Review = values in this interval not displayed.      Recent Labs  Lab 11/13/14  1006   PT 14.8   PT INR 1.2*   PTT 22*      Recent Labs  Lab 11/16/14  1055 11/16/14  0418  11/15/14  0323 11/14/14  0533   SODIUM 133* 135* More results in Results Review 127*  --    POTASSIUM  --  4.4  --  3.8  --    CHLORIDE  --  102  --  98*  --    CO2  --  27  --  23  --    BUN  --  8.0  --  6.0*  --    CREATININE  --  0.7  --  0.6  --    CALCIUM  --  8.8  --  8.1*  --    ALBUMIN  --   --   --   --  2.9*   PROTEIN, TOTAL  --   --   --   --  5.0*   BILIRUBIN, TOTAL  --   --   --   --  <0.1*   ALKALINE PHOSPHATASE  --   --   --   --  87   ALT  --   --   --   --  10   AST (SGOT)  --   --   --   --  11   GLUCOSE  --  104*  --  114*  --    More results in Results Review = values in this interval not displayed.                Microbiology, reviewed and are significant for:  Microbiology Results     Procedure Component Value Units Date/Time    MRSA culture [[id number]] Collected:  11/13/14 0430    Specimen Information:  Body Fluid from Nasal/Throat ASC Admission Updated:  11/14/14 4098    Narrative:      ORDER#: [id number]                                    ORDERED BY: Joya San  SOURCE: Nares and Throat                             COLLECTED:  11/13/14 04:30  ANTIBIOTICS AT COLL.:                                RECEIVED :  11/13/14 09:36  Culture MRSA Surveillance                  FINAL       11/14/14 09:22  11/14/14   Negative for Methicillin Resistant Staph aureus from Nares and             Negative for Methicillin Resistant Staph  aureus from Throat  Imaging, reviewed and are significant for:  Ct Abdomen Pelvis Wo Iv/ Wo Po Cont    11/14/2014     Redemonstration of a large left groin hematoma, which appears slightly better organized compared to prior study as above. Active extravasation or relationship to adjacent vasculature cannot be evaluated due to lack of IV contrast administration.  [name], MD  11/14/2014 3:03 PM     Ct Angiogram Head    11/12/2014    1. A stent is present within the right internal carotid siphon. 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 Additional details are discussed above. 2. Normal examination of the brain.  [name], MD  11/12/2014 10:49 AM     Ct Head Wo Contrast    11/15/2014    Embolization coils and stent are seen in the region of the right cavernous sinus. Evaluation of the adjacent brain parenchyma is suboptimal due to marked streak artifact.  Within limitation, no acute abnormality.  [name], MD  11/15/2014 2:30 AM     Ct Head W Wo Contrast    11/12/2014    1. A stent is present within the right internal carotid siphon. 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 Additional details are discussed above. 2. Normal examination of the brain.  [name], MD  11/12/2014 10:49 AM     Ct Angiogram Chest    11/12/2014    1. Negative for pulmonary embolism. 2. Small focus of discoid atelectasis in the left lower lobe. 3. There is a large left groin hematoma with active extravasation of contrast material, possibly from the femoral vein.  Urgent findings were reported by telephone to Dr. Jearld Shines.  [name], MD  11/12/2014 8:37 PM     Ct Abdomen Pelvis W Iv Contrast Only    11/13/2014    1. Negative for pulmonary embolism. 2. Small focus of discoid atelectasis in the left lower lobe. 3. There is a large left groin hematoma with active extravasation of contrast material, possibly from the femoral vein.  Urgent findings were reported by telephone to Dr. Jearld Shines.  [name], MD  11/12/2014 8:37  PM     Xr Chest Ap Portable    11/12/2014    Low lung volume with small focus of left basilar atelectasis.  [name], MD  11/12/2014 8:02 PM     [place] Groin Pseudoaneurysm Left W Dopp    11/13/2014     No evidence of left groin pseudoaneurysm or fistula.   7.6 cm left groin hematoma as described above.  [name], MD  11/13/2014 1:41 PM     Neuro Embolization    11/14/2014     Right cavernous carotid fistula related to rupture of previously treated right internal carotid artery cavernous aneurysm occluded using a transvenous approach and DCS ORBIT coils.  [name], MD  11/14/2014 2:32 PM           Signed by: Isaias Cowman, MD

## 2014-11-16 NOTE — Plan of Care (Addendum)
Problem: Safety  Goal: Patient will be free from injury during hospitalization  Outcome: Progressing  Bed alarm and tele sitter on. Bed in the lowest position. Fall mat in place. Call light within reach. Instructed to call for help or any needs or to request assistance before attempting to get up. verbalized understanding. .     Problem: Pain  Goal: Patient's pain/discomfort is manageable  Outcome: Progressing    Problem: Potential for Compromised Skin Integrity  Goal: Skin integrity is maintained or improved  Assess and monitor skin integrity. Identify patients at risk for skin breakdown on admission and per policy. Collaborate with interdisciplinary team and initiate plans and interventions as needed.   Outcome: Progressing    Problem: Inadequate Tissue Perfusion  Goal: Adequate tissue perfusion will be maintained  Outcome: Progressing    Comments:   Pt A&Ox4. MAE, with limited movement. Hematoma on,L leg and groin area,  ICE pack in place, helps with pain.  C/o of pain on  leg, head and eye,  Diludid IV given q2hr, oxycodone @4hrhr  with zofran.  pt states still having double vision and sensitive to light. Pt in 1.5l fluid restriction, good appetite. Dressing on R groin area, clean, dry & intact. Maintained Q6 NA ck. safety precaution in place. Continue to monitor.

## 2014-11-16 NOTE — Plan of Care (Signed)
Problem: Safety  Goal: Patient will be free from injury during hospitalization  Outcome: Progressing  Status: Pt instructed to call for assistance. Verbalized understanding. Call bell and bedside table within reach and pt knows how to use. Bed alarm and fall mat in place.  Plan: Will continue to monitor with hourly rounding.    Problem: Pain  Goal: Patient's pain/discomfort is manageable  Outcome: Progressing  Pain more controlled with Oxycodone.

## 2014-11-16 NOTE — Progress Notes (Signed)
Pt alert and oriented and able to make needs known. Pain med given as needed with adequate pain relief. Pt up to the BR with one assist. Breathing easy on RA.  Remains on q6 NA check and q12 h/h check. Mid line inserted to right upper arm. NAD. VSS. Will continue to monitor with hourly rounding.

## 2014-11-16 NOTE — Procedures (Signed)
MIDLINE INSERTION PROCEDURE     Daughenbaugh,Assata  11/16/2014    INDICATIONS: Therapy less than 28 days    The midline procedure, risks, benefits were discussed with thepatient.  All questions were answered  patient verbalized understanding and agreed to proceed. Midline education/instructions provided to patient.    PROCEDURE DETAILS:   The patient was positioned and Ultrasound was used to confirm patency of the Right Basilic vein prior to obtaining venous access. The arm was scrubbed with 2% chlorhexidine per guidelines and a maximal sterile field was established for the patient.  The clinician was attired with cap, mask and sterile gown/gloves prior to start.     Trimable 20cm Non-Power Poly Midline:  A sterile cover was sheathed to the Ultrasound probe.  The vein was then revisualized and 1% lidocaine injected prior to puncture of the Right Basilic vein with a 21-gauge single-wall needle under direct sonographic guidance.  The guidewire was advanced through the needle, the needle was removed and a peel-away sheath was placed over the wire.  After measuring from the insertion site to the midline of the upper arm the catheter was trimmed to insure tip location below the axillary.  The catheter was then advanced through the peel-away sheath.  The sheath was removed.  The catheter was flushed with normal saline to confirm brisk blood return and capped.  The catheter was stabilized on the skin using a securement device.  Antimicrobial disk and sterile transparent occlusive dressing applied using aseptic technique.    Patient did tolerate the procedure well.   Catheter Type: 4Fr non-power Poly Midline  Insertion Site: Right Basilic vein  Total length: 15cm  Internal Length:  15cm  External Length: 0cm  UAC: 35cm    Midline Reference #: [id number]  Midline Kit Lot#: [id number]  Midline Kit expiration date: 9/17    Findings/Conclusions:  No signs of bleeding or symptoms of nerve irritation noted at time of insertion  procedure.    Tip location is below the level of the axilla in Basilic vein. Midline is ready for immediate use.    Midline is ready for immediate use    Peterson Lombard, RN

## 2014-11-17 LAB — GFR: EGFR: >60

## 2014-11-17 LAB — CBC
Hematocrit: 20.6 % — ABNORMAL LOW (ref 37.0–47.0)
Hgb: 6.9 g/dL — ABNORMAL LOW (ref 12.0–16.0)
MCH: 30 pg (ref 28.0–32.0)
MCHC: 33.5 g/dL (ref 32.0–36.0)
MCV: 89.6 fL (ref 80.0–100.0)
MPV: 8.3 fL — ABNORMAL LOW (ref 9.4–12.3)
Nucleated RBC: 0 /100 WBC (ref 0–1)
Platelets: 320 10*3/uL (ref 140–400)
RBC: 2.3 10*6/uL — ABNORMAL LOW (ref 4.20–5.40)
RDW: 13 % (ref 12–15)
WBC: 5.52 10*3/uL (ref 3.50–10.80)

## 2014-11-17 LAB — BASIC METABOLIC PANEL
BUN: 9 mg/dL (ref 7.0–19.0)
CO2: 23 mEq/L (ref 22–29)
Calcium: 8 mg/dL — ABNORMAL LOW (ref 8.5–10.5)
Chloride: 105 mEq/L (ref 100–111)
Creatinine: 0.6 mg/dL (ref 0.6–1.0)
Glucose: 97 mg/dL (ref 70–100)
Potassium: 3.5 mEq/L (ref 3.5–5.1)
Sodium: 135 mEq/L — ABNORMAL LOW (ref 136–145)

## 2014-11-17 LAB — PREPARE RBC
Expiration Date: 201608142359
Status: TRANSFUSED
UTYPE: O NEG

## 2014-11-17 LAB — PT/INR
PT INR: 1 (ref 0.9–1.1)
PT: 13.6 s (ref 12.6–15.0)

## 2014-11-17 LAB — TYPE AND SCREEN
AB Screen Gel: NEGATIVE
ABO Rh: O POS

## 2014-11-17 LAB — HEMOGLOBIN AND HEMATOCRIT, BLOOD
Hematocrit: 26.7 % — ABNORMAL LOW (ref 37.0–47.0)
Hgb: 9.1 g/dL — ABNORMAL LOW (ref 12.0–16.0)

## 2014-11-17 LAB — SODIUM: Sodium: 133 mEq/L — ABNORMAL LOW (ref 136–145)

## 2014-11-17 MED ORDER — ACETAMINOPHEN 325 MG PO TABS
650.0000 mg | ORAL_TABLET | Freq: Once | ORAL | Status: AC
Start: 2014-11-17 — End: 2014-11-17
  Administered 2014-11-17: 650 mg via ORAL
  Filled 2014-11-17: qty 2

## 2014-11-17 MED ORDER — SODIUM CHLORIDE 0.9 % IV SOLN
INTRAVENOUS | Status: DC | PRN
Start: 2014-11-17 — End: 2014-11-19

## 2014-11-17 MED ORDER — SODIUM CHLORIDE 1 G PO TABS
1.0000 g | ORAL_TABLET | Freq: Three times a day (TID) | ORAL | Status: DC
Start: 2014-11-18 — End: 2014-11-19
  Administered 2014-11-18 – 2014-11-19 (×5): 1 g via ORAL
  Filled 2014-11-17 (×5): qty 1

## 2014-11-17 MED ORDER — DIPHENHYDRAMINE HCL 25 MG PO CAPS
25.0000 mg | ORAL_CAPSULE | Freq: Once | ORAL | Status: AC
Start: 2014-11-17 — End: 2014-11-17
  Administered 2014-11-17: 25 mg via ORAL
  Filled 2014-11-17: qty 1

## 2014-11-17 NOTE — Plan of Care (Addendum)
Problem: Safety  Goal: Patient will be free from injury during hospitalization  Outcome: Progressing  Pt A & O x 4. MAE. Ambulates with standby assist if pain is managed adequately with oxycodone q4h. Blood transfusion this morning. Current Hg is 9.1, Hct 26. 7. Na draws q 6 through midline. Last draw 1800. PT/INR within normal range. Pt has been doing active leg exercises to improve strength. Significant bruising circling the L upper thigh into the pubic region. Bruising outlined in red marker and dated. MD would like bruise outlined and dated each day. Pt noted that edema in bruised area has diminished. Current Na 133 on fluid restriction of 1000 mL/day. Pt motivated to follow instructions for care. Daughter present and attentive to needs. Will continue to monitor for safety and lab levels.     Problem: Pain  Goal: Patient's pain/discomfort is manageable  Outcome: Progressing  Pt must have prn oxycodone precisely q4h to control H/A and back pain. Next oxy due at 13. Pt prefers to take with milk and graham crackers to prevent nausea. Next dose of Zofran should be given at 2150. Per pt request, RN should awaken pt for pain medication q4h. Pt also needs ice packs for H/A pain. Pain managed to +4/10 with q4h dosing. Will continue to monitor.

## 2014-11-17 NOTE — Progress Notes (Signed)
Discharge Planner contacted [facility]#[id number] and VM left for [facility] PT and equipment RW.  Dub Amis, MSW  [facility] Discharge Planner  # [phone]

## 2014-11-17 NOTE — Plan of Care (Signed)
Hg 6.9, transfusion order for 1 unit of PRBC entered, Pt notified.

## 2014-11-17 NOTE — PT Eval Note (Signed)
Alexandria Trevino   Physical Therapy Evaluation     Patient: Alexandria Trevino    MRN#: [id number]   Unit: [facility] T[facility] 7  Bed: F738/F738.01    Discharge Recommendations:   Discharge Recommendation: Home with supervision, Home with home health PT (family assist as needed)       DME Recommendation: has walker at home           Assessment:   Alexandria Trevino is a 59 y.o. female admitted 11/12/2014 with pain (both headache and L LE), large L groin hematoma, limited L LE ROM/strength limited functional mobility and limited gait tolerance.  May benefit from skilled PT to maximize functional independence and mobility.      Therapy Diagnosis: decreased independence with functional mobility and gait      Rehabilitation Potential: good    Treatment Activities: functional mobility training, gait training, therapeutic exercise  Educated the patient to role of physical therapy, plan of care, goals of therapy and HEP, safety with mobility and ADLs.    Plan:   PT Frequency: 2-3x/wk    Treatment/Interventions: functional mobility training, therapeutic exercise, gait training    Risks/benefits/POC discussed with patient       Precautions and Contraindications:   Fall    Consult received for [name] for PT Evaluation and Treatment.  Patient's medical condition is appropriate for Physical Therapy intervention at this time.    History of Present Illness:   Alexandria Trevino is a 60 y.o. female admitted on 11/12/2014 with proptosis,chemosis, diplopia. 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  Developed post procedure groin hematoma.    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 Active extravasation or relationship to adjacent vasculature cannot be evaluated due to lack of IV contrast administration. Alexandria Spinner, MD 11/14/2014 3:03 PM     Ct Angiogram Head    11/12/2014 1. A stent is present within the right internal carotid siphon. 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 Additional details are discussed above. 2. Normal examination of the brain. Alexandria Maidens, MD 11/12/2014 10:49 AM     Ct Head Wo Contrast    11/15/2014 Embolization coils and stent are seen in the region of the right cavernous sinus. Evaluation of the adjacent brain parenchyma is suboptimal due to marked streak artifact. Within limitation, no acute abnormality. [name], MD 11/15/2014 2:30 AM     Ct Head W Wo Contrast    11/12/2014 1. A stent is present within the right internal carotid siphon. 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 Additional details are discussed above. 2. Normal examination of the brain. Alexandria Maidens, MD 11/12/2014 10:49 AM     Ct Angiogram Chest    11/12/2014 1. Negative for pulmonary embolism. 2. Small focus of discoid atelectasis in the left lower lobe. 3. There is a large left groin hematoma with active extravasation of contrast material, possibly from the femoral vein. Urgent findings were reported by telephone to Dr. Jearld Shines. Alexandria Birch, MD 11/12/2014 8:37 PM     Ct  Abdomen Pelvis W Iv Contrast Only    11/13/2014 1. Negative for pulmonary embolism. 2. Small focus of discoid atelectasis in the left lower lobe. 3. There is a large left groin hematoma with active extravasation of contrast material, possibly from the femoral vein. Urgent findings were reported by telephone to Dr. Jearld Shines. Alexandria Birch, MD 11/12/2014 8:37 PM     Xr Chest Ap Portable    11/12/2014 Low lung volume with small focus of left basilar atelectasis. Alexandria Birch, MD  11/12/2014 8:02 PM     [place] Groin Pseudoaneurysm Left W Dopp    11/13/2014 No evidence of left groin pseudoaneurysm or fistula. 7.6 cm left groin hematoma as described above. 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 Alexandria Crocker, MD 11/14/2014 2:32 PM       Results for TOMECA, HELM (MRN [id number]) as of 11/17/2014 10:21   Ref. Range 11/16/2014 17:37 11/16/2014 21:31 11/17/2014 04:57 11/17/2014 04:58   Hemoglobin Latest Ref Range: 12.0-16.0 g/dL 7.6 (L)   6.9 (L)   Hematocrit Latest Ref Range: 37.0-47.0 % 22.2 (L)   20.6 (L)       Social History:   Prior Level of Function: independent  Assistive Devices: none  Baseline Activity: community ambulation. Retired. Likes to work in garden.  DME Currently at Home: walker  Home Living Arrangements: with spouse. Daughter coming from [place] to assist with recovery  Type of Home: [facility]  Home Layout: multilevel    Subjective:   Patient is agreeable to participation in the therapy session. Nursing clears patient for therapy.     Patient Goal: to stop hurting    Pain:   Scale: 8/10 headache, 5/10 L LE  Intervention: RN notified    Objective:   Patient is in bed with dressings and PICC line in place.  Noted swelling, ecchymosis L groin into flank and thigh.      Cognitive Status and Neuro Exam:  A and O x 3.  Follows all commands.    Musculoskeletal Examination  RUE ROM: WFL  LUE ROM: WFL  RLE ROM: WFL  LLE ROM: passively WFL    RUE Strength: WFL  LUE Strength: WFL  RLE Strength: WFL  LLE Strength: hip flex/abd/add 2+ to 3-/5 with pain, knee ext WFL, ankle WFL    Functional Mobility  Rolling: with assist for LEs  Supine to Sit: Min A with assist for LEs  Scooting: CGA  Sit to Stand: CGA  Stand to Sit: CGA  Transfers: CGA    Ambulation  Level of assistance  required: CGA  Ambulation Distance: 20 ' x 2  Pattern: decreased step length L> R, flexed trunk  Device Used: wheeled walker  Weightbearing Status: no restrictions      Balance  Static Sitting: good  Dynamic Sitting: good  Static Standing: good - at walker  Dynamic Standing: fair at walker    Participation and Activity Tolerance  Participation Effort: good  Endurance: limited by pain    Patient left with call bell within reach, all needs met, SCDs not in place as found, fall mat in place, bed alarm n/a, chair alarm in place and all questions answered. RN notified of session outcome and patient response.     Goals:  Goals  Goal Formulation: With patient  Time for Goal  Acheivement: 3 visits  Goals: Select goal  Pt Will Go Supine To Sit: modified independent  Pt Will Perform Sit to Stand: modified independent  Pt Will Transfer Bed/Chair: modified independent  Pt Will Ambulate: 151-200 feet, with rolling walker, with supervision  Pt Will Go Up / Down Stairs: 3-5 stairs, with stand by assist  Pt Will Perform Home Exer Program: independent      Artis Flock Arline Asp) Craige Cotta, PT, DPT  Pager [phone]      Time of Treatment  PT Received On: 11/17/14  Start Time: 1055  Stop Time: 1140  Time Calculation (min): 45 min

## 2014-11-17 NOTE — Progress Notes (Signed)
Discharge Planner met with Pt in her room/at bedside for discharge planning. Pt stated "i'm doing ok. Getting transfusion today". Pt reported no present discharge concern needs. Presently pending PT/OT assessments.  Discharge Planner will continue to assist with discharge needs.  Dub Amis, MSW  [facility] Discharge Planner  # [phone]

## 2014-11-17 NOTE — Progress Notes (Signed)
MEDICINE PROGRESS NOTE    Date Time: 11/17/2014 8:28 PM  Patient Name: Alexandria Trevino  Attending Physician: Rodney Booze, MD    Assessment:   Principal Problem:    Carotid-cavernous fistula  Active Problems:    Cerebral aneurysm without rupture    Hypothyroidism    Asthma    Arthritis    Anemia    Hematoma, groin, post op    Groin hematoma, initial encounter    Anemia due to other cause    Mild intermittent asthma without complication    Other specified hypothyroidism    Acute hyponatremia  Resolved Problems:    * No resolved hospital problems. *    Plan:   1. Left groin hematoma- S/p prbc 1 unit early this morning. Inappropriate rise in H/H, Hb 9.1. Check cbc tomorrow. Repeat CT shows slightly larger hematoma however coalescing and stable, No active bleeding,US did not show evidence of pseudoaneurysm.   Unable to stop  dual antiplatelet agents given presence of pipeline placement.      2.Anemia: could be lab error this morning, hematoma is the main cause of anemia. Check hemolytic panel. Stool occult. Monitor H/H.     3. CCF s/p coil embolization: Discussed with Neuro IR (Dr. Aleen Sells) and pain likely post-procedure pain.Repeat CT Head does not show any acute bleed, defer further imaging at this time per Neuro IR. Continue dual antiplatelet therapy given presence of pipeline placement. Pain control with Dilaudid and Oxycodone.On Decadron 1 mg BID per Neuro IR recs for inflammation control     4. Hyponatremia- Most recent value of 133, Na had acutely dropped from 135 to 126 in less than 48 hours previously. Was previously on hypertonic saline given acute drop however now discontinued.Correction goal max 8-10 meq in first 24 hours to avoid rapid overcorrection, Fluid restrict to 1.5 L/day. Will add salt tabs.     4. Hypothyrodism- No acute issues and TSH wnl. Continue Synthroid 88 mcg q day    5. Asthma- No acute issues, Continue Albuterol prn     6.DVT ppx: due to hematoma, requiring blood transfusion.  Holding lovenox.     Case discussed with: Neuro IR (Dr. Aleen Sells), Patient, Family, RN    I called pts husband on the phone and updated him.     Safety Checklist:     DVT prophylaxis:  CHEST guideline (See page [id number]) Chemical and / or mechanical ppx NOT indicated or contraindicated: Active Bleeding    Foley:  Pelion Rn Foley protocol Not present   IVs:  Peripheral IV   PT/OT: Ordered   Daily CBC & or Chem ordered:  SHM/ABIM guidelines (see #5) Yes, due to clinical and lab instability   Reference for approximate charges of common labs: CBC auto diff - $76  BMP - $99  Mg - $79    Lines:     Patient Lines/Drains/Airways Status    Active PICC Line / CVC Line / PIV Line / Drain / Airway / Intraosseous Line / Epidural Line / ART Line / Line / Wound / Pressure Ulcer / NG/OG Tube     Name:   Placement date:   Placement time:   Site:   Days:    Peripheral IV 11/12/14 Right Wrist  11/12/14      Wrist   2    Puncture Site 11/12/14 Femoral Left;Right Groin  11/12/14   1650     1    Puncture Site 11/12/14 Femoral Right Groin  11/12/14  2                 Disposition:     Today's date: 11/17/2014  Admit Date: 11/12/2014 11:56 AM  LOS: 5  Anticipated medical stability for discharge:Red - not tomorrow - estimated month/date: Pending stabilization of hematoma, hyponatremia  Service status: inpt  Reason for ongoing hospitalization: Bleeding groin hematoma, acute hyponatremia  Anticipated discharge needs: Pending PT/OT evaluation       Subjective     CC: Carotid-cavernous fistula    Interval History/24 hour events: Pt complaints of nursing staff. Headache 8-9/10,behind rt eye. No eye. doesnot think that the left groin swelling worsening, significant bruising on the left leg. Left leg pain worsens with movement.       HPI/Subjective: 59 yo female with hx of HTN, asthma, hypothyroidism s/p pipeline placement for R ICA cavernous aneurysm on 10/25/16, developed new right sided bruit, proptosis, chemosis and double vision, and severe  headache, who was admitted for an elective coil embolization of R CCF evidenced on CTA.  States she is doing better.  Reports she wants to work with PT/OT.  Reports pain above right eye improving.  Denies chest pain, abdominal pain, or shortness of breath.      Review of Systems:     Gen: Denies fevers, chills  CV: Denies chest pain  Resp: Denies shortness of breath  Abd: Reports L groin pain  Neuro: Reports pain above R eye     Physical Exam:     VITAL SIGNS PHYSICAL EXAM   Temp:  [96.3 F (35.7 C)-98.2 F (36.8 C)] 98.2 F (36.8 C)  Heart Rate:  [18-81] 79  Resp Rate:  [17-18] 18  BP: (117-147)/(65-83) 117/73 mmHg    Intake/Output Summary (Last 24 hours) at 11/17/14 2028  Last data filed at 11/17/14 1800   Gross per 24 hour   Intake   1035 ml   Output      0 ml   Net   1035 ml    Physical Exam  General: awake, alert, oriented x 3; no acute distress, appears anxious   HEENT: perrla, eomi, sclera anicteric oropharynx clear without lesions, mucous membranes moist  Neck: supple, no lymphadenopathy, no thyromegaly, no JVD, no carotid bruits  Cardiovascular: regular rate and rhythm, no murmurs, rubs or gallops  Lungs: clear to auscultation bilaterally, without wheezing, rhonchi, or rales  Abdomen: soft, non-tender, non-distended; no palpable masses, no hepatosplenomegaly, normoactive bowel sounds, no rebound or guarding  Extremities: left groin swelling on the anterior thigh, significant bruising on the posterior, lateral thigh. Marked.   Neuro: cranial nerves 2-12 grossly intact, strength 5/5 in upper and lower extremities, sensation intact, speech fluent, answers questions appropriately       Meds:     Medications were reviewed:    Labs:     Labs (last 72 hours):      Recent Labs  Lab 11/17/14  1141 11/17/14  0458 11/16/14  1737   WBC  --  5.52 6.66   HGB 9.1* 6.9* 7.6*   HEMATOCRIT 26.7* 20.6* 22.2*   PLATELETS  --  320 377         Recent Labs  Lab 11/17/14  1748 11/13/14  1006   PT 13.6 14.8   PT INR 1.0 1.2*    PTT  --  22*      Recent Labs  Lab 11/17/14  1748 11/17/14  0458  11/16/14  1737  11/14/14  0533   SODIUM 133*  135* More results in Results Review 136 More results in Results Review  --    POTASSIUM  --  3.5  --  3.9 More results in Results Review  --    CHLORIDE  --  105  --  102 More results in Results Review  --    CO2  --  23  --  24 More results in Results Review  --    BUN  --  9.0  --  10.0 More results in Results Review  --    CREATININE  --  0.6  --  0.7 More results in Results Review  --    CALCIUM  --  8.0*  --  8.8 More results in Results Review  --    ALBUMIN  --   --   --   --   --  2.9*   PROTEIN, TOTAL  --   --   --   --   --  5.0*   BILIRUBIN, TOTAL  --   --   --   --   --  <0.1*   ALKALINE PHOSPHATASE  --   --   --   --   --  87   ALT  --   --   --   --   --  10   AST (SGOT)  --   --   --   --   --  11   GLUCOSE  --  97  --  101* More results in Results Review  --    More results in Results Review = values in this interval not displayed.                Microbiology, reviewed and are significant for:  Microbiology Results     Procedure Component Value Units Date/Time    MRSA culture [[id number]] Collected:  11/13/14 0430    Specimen Information:  Body Fluid from Nasal/Throat ASC Admission Updated:  11/14/14 5956    Narrative:      ORDER#: [id number]                                    ORDERED BY: Joya San  SOURCE: Nares and Throat                             COLLECTED:  11/13/14 04:30  ANTIBIOTICS AT COLL.:                                RECEIVED :  11/13/14 09:36  Culture MRSA Surveillance                  FINAL       11/14/14 09:22  11/14/14   Negative for Methicillin Resistant Staph aureus from Nares and             Negative for Methicillin Resistant Staph aureus from Throat              Imaging, reviewed and are significant for:  Ct Abdomen Pelvis Wo Iv/ Wo Po Cont    11/14/2014     Redemonstration of a large left groin hematoma, which appears slightly better organized compared to prior study  as above. Active extravasation or relationship to adjacent vasculature cannot be evaluated due to lack of IV contrast  administration.  [name], MD  11/14/2014 3:03 PM     Ct Angiogram Head    11/12/2014    1. A stent is present within the right internal carotid siphon. 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 Additional details are discussed above. 2. Normal examination of the brain.  [name], MD  11/12/2014 10:49 AM     Ct Head Wo Contrast    11/15/2014    Embolization coils and stent are seen in the region of the right cavernous sinus. Evaluation of the adjacent brain parenchyma is suboptimal due to marked streak artifact.  Within limitation, no acute abnormality.  [name], MD  11/15/2014 2:30 AM     Ct Head W Wo Contrast    11/12/2014    1. A stent is present within the right internal carotid siphon. 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 Additional details are discussed above. 2. Normal examination of the brain.  [name], MD  11/12/2014 10:49 AM     Ct Angiogram Chest    11/12/2014    1. Negative for pulmonary embolism. 2. Small focus of discoid atelectasis in the left lower lobe. 3. There is a large left groin hematoma with active extravasation of contrast material, possibly from the femoral vein.  Urgent findings were reported by telephone to Dr. Jearld Shines.  [name], MD  11/12/2014 8:37 PM     Ct Abdomen Pelvis W Iv Contrast Only    11/13/2014    1. Negative for pulmonary embolism. 2. Small focus of discoid atelectasis in the left lower lobe. 3. There is a large left groin hematoma with active extravasation of contrast material, possibly from the femoral vein.  Urgent findings were reported by telephone to Dr. Jearld Shines.  [name], MD  11/12/2014 8:37 PM     Xr Chest Ap Portable    11/12/2014    Low lung volume with small focus of left basilar atelectasis.  [name], MD  11/12/2014 8:02 PM     [place] Groin Pseudoaneurysm Left W Dopp    11/13/2014     No evidence of left groin  pseudoaneurysm or fistula.   7.6 cm left groin hematoma as described above.  [name], MD  11/13/2014 1:41 PM     Neuro Embolization    11/14/2014     Right cavernous carotid fistula related to rupture of previously treated right internal carotid artery cavernous aneurysm occluded using a transvenous approach and DCS ORBIT coils.  [name], MD  11/14/2014 2:32 PM           Signed by: Koryn Charlot Lafonda Mosses, MD

## 2014-11-17 NOTE — Progress Notes (Signed)
Chaplain Service    Background:  Visit Type: Initial was made by Chaplain with patient, [name], based on Source: Family Request.  Present at Visit: patient, family members at bedside.  Spiritual Care Provided to: patient and family members.  Length of Visit: 16-30 minutes .    Summary:  Reason for Request: Spiritual Support       Spiritual Care Outcomes: Patient expressed appreciation of visit, Family expressed appreciation of visit, Increased sense of hope    Assessment:  Ms. Ritthaler daughter came by the Pastoral care office to request spiritual care for her mother in [place].  The family and patient are known by this chaplain from previous hospitalization.   Provided emotional and spiritual support through empathetic and active listening, affirmation of feelings and faith, and prayer.  Ms. Tolin and family were most gracious.  Pastoral care will continue to follow.    Rev. Debbora Dus, MDiv, DMin   Staff Chaplain  Pastoral Care Department  [phone]

## 2014-11-17 NOTE — OT Eval Note (Signed)
[facility]   Occupational Therapy Evaluation     Patient: Alexandria Trevino    MRN#: [id number]   Unit: [facility] TOWER 7  Bed: F738/F738.01                                     Discharge Recommendations:   Discharge Recommendation: Other (Comment) (Home with husband)   DME Recommended for Discharge: Front wheel walker (Grab bar in place in shower. )    Discharge recommendations are subject to change based on patients progress.  Please refer to the most recent treatment note for the most up to date recommendation. Thank you!    Assessment:   Alexandria Trevino is a 59 y.o. female admitted 11/12/2014 with RICA aneurysm for pipeline placement with postop groin hematoma. Pt with vision impairments (diplopia), groin hematoma with pain and decreased functional ROM for self care. She is motivated for progress and return of independence.  Teaching completed this session. Recommend follow up OT to reinforce while pt remains in house. Pt reports very supportive spouse for self care assist at home.     Therapy Diagnosis: impaired self care    Rehabilitation Potential: good    Treatment Activities: OT evaluation, self care modifications: LB dress, safety, energy conservation, vision strategies: Patching, sunglasses for photosensitivity, localizing.     Educated the patient to role of occupational therapy, plan of care, goals of therapy and safety with mobility and ADLs, energy conservation techniques.    Plan:   OT Frequency Recommended: 2-3x/wk     Treatment/Interventions: self care    Risks/benefits/POC discussed with patient.       History of Present Illness:    Alexandria Trevino is a 59 y.o. female admitted on 11/12/2014 with s/p Pipeline placement for RICA cavernous aneurysm developed new right sided bruit, proptosis, chemosis and double vision, and severe headache. CTA shows right CCF. Admitted for management.  L abdominal wall hematoma with H&H drop. Pt s/p transfusion 11/17/14.       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 Hypothyroidism    . Asthma      exercise induced   . Anemia      Hx pre hysterectomy   . Arthritis      L thumb   . History of YAG laser capsulotomy of lens    . Brain aneurysm      RICA/tx'd 10/2014      Past Surgical History   Procedure Laterality Date   . Tonsillectomy  1970   . Breast, lumpectomy  1979     fibroid tumor   . Hysterectomy  1989     total abd hysterectomy   . Leg surgery Right 2008     spiral fx fib/ankle surgery   . Arm surgery Right 2003     tendon repair/upper and lower   . Cataract extraction Right 2015 x2     2nd due to lens slipping   . Cataract extraction Left 2015   . Sst  2014     L hand   . Heel spur surgery Left 2004   . Heel spur surgery Right 2005 & 2006   . Cerebral embolization  10/2014     PIPELINE STENT / RICA  X-Rays/Tests/Labs:  CT Head: IMPRESSION: Embolization coils and stent are seen in the region of the right cavernous sinus. Evaluation of the adjacent brain parenchyma is suboptimal due to marked streak artifact.   Within limitation, no acute abnormality.    R groin: IMPRESSION: No evidence of left groin pseudoaneurysm or fistula.   7.6 cm left groin hematoma as described above.    CT Chest: IMPRESSION: 1. Negative for pulmonary embolism.  2. Small focus of discoid atelectasis in the left lower lobe.  3. There is a large left groin hematoma with active extravasation of contrast material, possibly from the femoral vein.  Urgent findings were reported by telephone to Dr. Jearld Shines.    Social History:  Prior Functional Status: independent   Baseline Activity: community. Loves to work in garden, Teacher, early years/pre.   DME Currently Owned: grab bar in shower.   Lives with: husband who is retried, in multilevel [facility]. Walk in shower.     Subjective: "I have been getting  up by myself:     Patient is agreeable to participation in the therapy session. Nursing clears patient for therapy.      Patient Goal: home soon  Pain:   Scale: 6/10  Location: R eye headache  Intervention: rest, positioning, teaching about patching    Objective:   Patient's medical condition is appropriate for Occupational Therapy intervention at this time and patient cleared for therapy by RN.  Patient received seated in a bedside chair with L groin/thigh hematoma, intermittent eye closing due to diplopia, obese, PIV in place.      Cognitive Status:  Awake, alert, oriented. Motivated. Following all commands. Attentive to teaching.     Musculoskeletal Examination  RUE ROM: WFL   LUE ROM: WFL   Trunk/Neck ROM: WFL despite hematoma (body habitus limiting)    RUE Strength: WFL   LUE Strength: WFL   Trunk/Neck: WFL    Sensory/Oculomotor Examination  Auditory: intact  Tactile: intact  Vision: intermittent diplopia, indep self closure of alternating eyes (mostly R).  Glasses for distance at bedside. Visual strategies reviewed: localizing, font trials with size with typing.      Neuro   RAM: intact  Finger to nose: intact  Coordination: intact  Tone: normal     Activities of Daily Living  Eating: indep with tray setup.  Impaired standing tolerance for meal prep.   Grooming: standing sinkside with setup. Educated Warden/ranger.   Bathing: simulated standing with cga. Educated re safety, grab bar in place. Reports husband will be in bathroom with her as needed.   UE Dressing: setup  LE Dressing: cga standing management, seated good technique for socks.   Toileting: indep    Functional Mobility:  Supine to Sit: OOB  Sit to Stand: supervision to cga  Stand to sit: supervision to cga  Transfers: supervision to cga with/without RW     Balance  Static Sitting: good  Dynamic Sitting: good with LB dress  Static Standing: good-  Dynamic Standing: fair+ sinkside/keyboard to type    Participation and Activity Tolerance  Participation  Effort: great  Endurance: not limited for OT    Patient left with call bell within reach, all needs met, SCDs pt declined, fall mat in place and chair alarm engaged and all questions answered. RN notified of session outcome and patient response.         Goals:  Time For Goal Achievement: 2 visits  ADL Goals  Patient will groom self: Independent  Patient will dress  lower body: Independent, Modified Independent  Patient will toilet: Independent  Mobility and Transfer Goals  Pt will perform functional transfers: Independent, Modified Independent, with rolling walker  Neuro Re-Ed Goals  Pt will perform dynamic standing balance: Independent, for 10 minutes, to complete standing ADLs safely                        Thank you for the consult.  Ernst Bowler, MS, OTR/L  Pager [phone]        Time of treatment:   OT Received On: 11/17/14  Start Time: 1141  Stop Time: 1226  Time Calculation (min): 45 min

## 2014-11-18 ENCOUNTER — Encounter: Payer: Self-pay | Admitting: Body Imaging

## 2014-11-18 LAB — CBC
Hematocrit: 25.3 % — ABNORMAL LOW (ref 37.0–47.0)
Hgb: 8.5 g/dL — ABNORMAL LOW (ref 12.0–16.0)
MCH: 30.2 pg (ref 28.0–32.0)
MCHC: 33.6 g/dL (ref 32.0–36.0)
MCV: 90 fL (ref 80.0–100.0)
MPV: 8.3 fL — ABNORMAL LOW (ref 9.4–12.3)
Nucleated RBC: 0 /100 WBC (ref 0–1)
Platelets: 371 10*3/uL (ref 140–400)
RBC: 2.81 10*6/uL — ABNORMAL LOW (ref 4.20–5.40)
RDW: 14 % (ref 12–15)
WBC: 7.63 10*3/uL (ref 3.50–10.80)

## 2014-11-18 LAB — URINALYSIS WITH MICROSCOPIC
Bilirubin, UA: NEGATIVE
Blood, UA: NEGATIVE
Glucose, UA: NEGATIVE
Ketones UA: NEGATIVE
Leukocyte Esterase, UA: NEGATIVE
Nitrite, UA: NEGATIVE
Protein, UR: NEGATIVE
Specific Gravity UA: 1.002 (ref 1.001–1.035)
Urine pH: 6.5 (ref 5.0–8.0)
Urobilinogen, UA: NORMAL mg/dL

## 2014-11-18 LAB — OSMOLALITY: Osmolality: 278 mosm/kg — ABNORMAL LOW (ref 280–300)

## 2014-11-18 LAB — HAPTOGLOBIN: Haptoglobin: 330 mg/dL — ABNORMAL HIGH (ref 35–250)

## 2014-11-18 LAB — SODIUM
Sodium: 135 mEq/L — ABNORMAL LOW (ref 136–145)
Sodium: 133 mEq/L — ABNORMAL LOW (ref 136–145)
Sodium: 133 mEq/L — ABNORMAL LOW (ref 136–145)

## 2014-11-18 LAB — STOOL OCCULT BLOOD: Stool Occult Blood: NEGATIVE

## 2014-11-18 MED ORDER — BISACODYL 10 MG RE SUPP
10.0000 mg | Freq: Once | RECTAL | Status: AC
Start: 2014-11-18 — End: 2014-11-18
  Administered 2014-11-18: 10 mg via RECTAL
  Filled 2014-11-18: qty 1

## 2014-11-18 MED ORDER — POLYETHYLENE GLYCOL 3350 17 G PO PACK
17.0000 g | PACK | Freq: Every day | ORAL | Status: DC
Start: 2014-11-18 — End: 2014-11-19
  Administered 2014-11-18: 17 g via ORAL
  Filled 2014-11-18 (×2): qty 1

## 2014-11-18 MED ORDER — ALUM & MAG HYDROXIDE-SIMETH 200-200-20 MG/5ML PO SUSP
30.0000 mL | ORAL | Status: DC | PRN
Start: 2014-11-18 — End: 2014-11-19
  Administered 2014-11-18: 30 mL via ORAL
  Filled 2014-11-18: qty 30

## 2014-11-18 NOTE — PT Progress Note (Signed)
[facility]   Physical Therapy Cancellation Note      Patient:  Alexandria Trevino MRN#:  [id number]  Unit:  [facility] TOWER 7 Room/Bed:  F738/F738.01    11/18/2014  Time: 1310      Pt not seen for physical therapy secondary to pt just received suppository and is declining mobility at this time. Will continue to follow.     Alvera Singh, PT, DPT  Pager [phone]

## 2014-11-18 NOTE — Progress Notes (Signed)
MEDICINE PROGRESS NOTE    Date Time: 11/18/2014 3:55 PM  Patient Name: Alexandria Trevino  Attending Physician: Rodney Booze, MD    Assessment:   Principal Problem:    Carotid-cavernous fistula  Active Problems:    Cerebral aneurysm without rupture    Hypothyroidism    Asthma    Arthritis    Anemia    Hematoma, groin, post op    Groin hematoma, initial encounter    Anemia due to other cause    Mild intermittent asthma without complication    Other specified hypothyroidism    Acute hyponatremia  Resolved Problems:    * No resolved hospital problems. *    Plan:   1. Left groin hematoma- S/p prbc 1 unit early this morning. Inappropriate rise in H/H, Hb 9.1. Check cbc tomorrow. Repeat CT shows slightly larger hematoma however coalescing and stable, No active bleeding,US did not show evidence of pseudoaneurysm.  Unable to stop  dual antiplatelet agents given presence of pipeline placement.      2.Anemia: could be lab error this morning, hematoma is the main cause of anemia. Hemolytic panel still pending, Inprocess. Hb today 8.5. CBC tomorrow.     3. CCF s/p coil embolization: Discussed with Neuro IR (Dr. Aleen Sells) and pain likely post-procedure pain.Repeat CT Head does not show any acute bleed, defer further imaging at this time per Neuro IR. Continue dual antiplatelet therapy given presence of pipeline placement. Pain control with Dilaudid and Oxycodone.On Decadron 1 mg BID per Neuro IR recs for inflammation control     4. Hyponatremia- Most recent value of 133, Na had acutely dropped from 135 to 126 in less than 48 hours previously. Was previously on hypertonic saline given acute drop however now discontinued.Correction goal max 8-10 meq in first 24 hours to avoid rapid overcorrection, Fluid restrict to 1.5 L/day. added salt tabs. Na stable at around 133.     4. Hypothyrodism- No acute issues and TSH wnl. Continue Synthroid 88 mcg q day    5. Asthma- No acute issues, Continue Albuterol prn     6.DVT ppx: due to  hematoma, requiring blood transfusion. Holding lovenox.     Case discussed with:  Patient, Daughter, RN    Safety Checklist:     DVT prophylaxis:  CHEST guideline (See page [id number]) Chemical and / or mechanical ppx NOT indicated or contraindicated: Active Bleeding    Foley:  [place] Rn Foley protocol Not present   IVs:  Peripheral IV   PT/OT: Ordered   Daily CBC & or Chem ordered:  SHM/ABIM guidelines (see #5) Yes, due to clinical and lab instability   Reference for approximate charges of common labs: CBC auto diff - $76  BMP - $99  Mg - $79    Lines:     Patient Lines/Drains/Airways Status    Active PICC Line / CVC Line / PIV Line / Drain / Airway / Intraosseous Line / Epidural Line / ART Line / Line / Wound / Pressure Ulcer / NG/OG Tube     Name:   Placement date:   Placement time:   Site:   Days:    Peripheral IV 11/12/14 Right Wrist  11/12/14      Wrist   2    Puncture Site 11/12/14 Femoral Left;Right Groin  11/12/14   1650     1    Puncture Site 11/12/14 Femoral Right Groin  11/12/14        2  Disposition:     Today's date: 11/18/2014  Admit Date: 11/12/2014 11:56 AM  LOS: 6  Anticipated medical stability for discharge:Red - not tomorrow - estimated month/date: Pending stabilization of hematoma, hyponatremia  Service status: inpt  Reason for ongoing hospitalization: Bleeding groin hematoma, acute hyponatremia  Anticipated discharge needs: Pending PT/OT evaluation       Subjective     CC: Carotid-cavernous fistula    Interval History/24 hour events: Pt appears more comfortable in the bed. Urinary frequency, no fever. Daughter at the bedside requests UA. Pts pain better controlled with pain meds and ice packs. Still constipated. Left groin swelling stable.     HPI/Subjective: 59 yo female with hx of HTN, asthma, hypothyroidism s/p pipeline placement for R ICA cavernous aneurysm on 10/25/16, developed new right sided bruit, proptosis, chemosis and double vision, and severe headache, who was admitted for  an elective coil embolization of R CCF evidenced on CTA.  States she is doing better.  Reports she wants to work with PT/OT.  Reports pain above right eye improving.  Denies chest pain, abdominal pain, or shortness of breath.      Review of Systems:     Gen: Denies fevers, chills  CV: Denies chest pain  Resp: Denies shortness of breath  Abd: Reports L groin pain  Neuro: Reports pain above R eye     Physical Exam:     VITAL SIGNS PHYSICAL EXAM   Temp:  [96.9 F (36.1 C)-98.2 F (36.8 C)] 97.4 F (36.3 C)  Heart Rate:  [74-89] 87  Resp Rate:  [18] 18  BP: (109-145)/(58-84) 109/58 mmHg    Intake/Output Summary (Last 24 hours) at 11/18/14 1555  Last data filed at 11/17/14 1800   Gross per 24 hour   Intake    375 ml   Output      0 ml   Net    375 ml    Physical Exam  General: awake, alert, oriented x 3  HEENT: perrla, eomi, sclera anicteric oropharynx clear without lesions, mucous membranes moist  Cardiovascular: regular rate and rhythm, no murmurs, rubs or gallops  Lungs: clear to auscultation bilaterally, without wheezing, rhonchi, or rales  Abdomen: soft, non-tender, non-distended; no palpable masses, no hepatosplenomegaly, normoactive bowel sounds, no rebound or guarding  Extremities: left groin swelling on the anterior thigh, significant bruising on the posterior, lateral thigh.    Neuro: cranial nerves 2-12 grossly intact, strength 5/5 in rt  upper and lower extremities,5/5 left upper extermity sensation intact, speech fluent, left leg movement limited due to pain. Able to lift antigravity.        Meds:     Medications were reviewed:    Labs:     Labs (last 72 hours):      Recent Labs  Lab 11/18/14  0942 11/17/14  1141 11/17/14  0458   WBC 7.63  --  5.52   HGB 8.5* 9.1* 6.9*   HEMATOCRIT 25.3* 26.7* 20.6*   PLATELETS 371  --  320         Recent Labs  Lab 11/17/14  1748 11/13/14  1006   PT 13.6 14.8   PT INR 1.0 1.2*   PTT  --  22*      Recent Labs  Lab 11/18/14  0942 11/18/14  0314  11/17/14  0458   11/16/14  1737  11/14/14  0533   SODIUM 133* 135* More results in Results Review 135* More results in Results Review 136 More results  in Results Review  --    POTASSIUM  --   --   --  3.5  --  3.9 More results in Results Review  --    CHLORIDE  --   --   --  105  --  102 More results in Results Review  --    CO2  --   --   --  23  --  24 More results in Results Review  --    BUN  --   --   --  9.0  --  10.0 More results in Results Review  --    CREATININE  --   --   --  0.6  --  0.7 More results in Results Review  --    CALCIUM  --   --   --  8.0*  --  8.8 More results in Results Review  --    ALBUMIN  --   --   --   --   --   --   --  2.9*   PROTEIN, TOTAL  --   --   --   --   --   --   --  5.0*   BILIRUBIN, TOTAL  --   --   --   --   --   --   --  <0.1*   ALKALINE PHOSPHATASE  --   --   --   --   --   --   --  87   ALT  --   --   --   --   --   --   --  10   AST (SGOT)  --   --   --   --   --   --   --  11   GLUCOSE  --   --   --  97  --  101* More results in Results Review  --    More results in Results Review = values in this interval not displayed.                Microbiology, reviewed and are significant for:  Microbiology Results     Procedure Component Value Units Date/Time    MRSA culture [[id number]] Collected:  11/13/14 0430    Specimen Information:  Body Fluid from Nasal/Throat ASC Admission Updated:  11/14/14 4010    Narrative:      ORDER#: [id number]                                    ORDERED BY: Joya San  SOURCE: Nares and Throat                             COLLECTED:  11/13/14 04:30  ANTIBIOTICS AT COLL.:                                RECEIVED :  11/13/14 09:36  Culture MRSA Surveillance                  FINAL       11/14/14 09:22  11/14/14   Negative for Methicillin Resistant Staph aureus from Nares and             Negative for Methicillin Resistant Staph aureus from Throat  Imaging, reviewed and are significant for:  Ct Abdomen Pelvis Wo Iv/ Wo Po Cont    11/14/2014      Redemonstration of a large left groin hematoma, which appears slightly better organized compared to prior study as above. Active extravasation or relationship to adjacent vasculature cannot be evaluated due to lack of IV contrast administration.  [name], MD  11/14/2014 3:03 PM     Ct Angiogram Head    11/12/2014    1. A stent is present within the right internal carotid siphon. 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 Additional details are discussed above. 2. Normal examination of the brain.  [name], MD  11/12/2014 10:49 AM     Ct Head Wo Contrast    11/15/2014    Embolization coils and stent are seen in the region of the right cavernous sinus. Evaluation of the adjacent brain parenchyma is suboptimal due to marked streak artifact.  Within limitation, no acute abnormality.  [name], MD  11/15/2014 2:30 AM     Ct Head W Wo Contrast    11/12/2014    1. A stent is present within the right internal carotid siphon. 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 Additional details are discussed above. 2. Normal examination of the brain.  [name], MD  11/12/2014 10:49 AM     Ct Angiogram Chest    11/12/2014    1. Negative for pulmonary embolism. 2. Small focus of discoid atelectasis in the left lower lobe. 3. There is a large left groin hematoma with active extravasation of contrast material, possibly from the femoral vein.  Urgent findings were reported by telephone to Dr. Jearld Shines.  [name], MD  11/12/2014 8:37 PM     Ct Abdomen Pelvis W Iv Contrast Only    11/13/2014    1. Negative for pulmonary embolism. 2. Small focus of discoid atelectasis in the left lower lobe. 3. There is a large left groin hematoma with active extravasation of contrast material, possibly from the femoral vein.  Urgent findings were reported by telephone to Dr. Jearld Shines.  [name], MD  11/12/2014 8:37 PM     Xr Chest Ap Portable    11/12/2014    Low lung volume with small focus of left basilar atelectasis.  [name], MD   11/12/2014 8:02 PM     [place] Groin Pseudoaneurysm Left W Dopp    11/13/2014     No evidence of left groin pseudoaneurysm or fistula.   7.6 cm left groin hematoma as described above.  [name], MD  11/13/2014 1:41 PM     Neuro Embolization    11/14/2014     Right cavernous carotid fistula related to rupture of previously treated right internal carotid artery cavernous aneurysm occluded using a transvenous approach and DCS ORBIT coils.  [name], MD  11/14/2014 2:32 PM           Signed by: Mackinze Criado Lafonda Mosses, MD

## 2014-11-18 NOTE — Plan of Care (Signed)
Patient is AOx3,f/c,mae- left lower extremity weaker than right due to hematoma and pain. Pain complaints managed with prn medications with some desired outcomes. Out of be with standby guard. MD notified by urinary frequency complaints, UA and urine/blood osmolality ordered. Has no other complaints, fall precautions in place, will continue to monitor pt for changes.

## 2014-11-18 NOTE — Plan of Care (Addendum)
Problem: Safety  Goal: Patient will be free from injury during hospitalization  Outcome: Progressing  Fall mats down, non-slip socks on, call bell within reach, Avasys in room. Pt ambulates OOB standby/contact assist. Steady on feet. Denies dizziness.   SCDs on.    Problem: Pain  Goal: Patient's pain/discomfort is manageable  Outcome: Progressing  Oxycodone given Q4h with good result. Pt monitors own pain. Headache (R) and L groin pain, increased with movement.     Comments:   Pt states double-vision is less severe. In good spirits. Pain managed well with Q4 oxycodone. Denies n/v, numbness/tingling.  Unable to check 0000 sodium as no working [employer] available at time. Last sodium at 0314, 135.   Pt eager to go home (hopes to be discharged today, 8/10). Family wishes pt would stay another day.. Will continue to monitor.

## 2014-11-19 ENCOUNTER — Other Ambulatory Visit: Payer: Self-pay

## 2014-11-19 LAB — CBC
Hematocrit: 25.7 % — ABNORMAL LOW (ref 37.0–47.0)
Hgb: 8.6 g/dL — ABNORMAL LOW (ref 12.0–16.0)
MCH: 30.4 pg (ref 28.0–32.0)
MCHC: 33.5 g/dL (ref 32.0–36.0)
MCV: 90.8 fL (ref 80.0–100.0)
MPV: 8.5 fL — ABNORMAL LOW (ref 9.4–12.3)
Nucleated RBC: 0 /100 WBC (ref 0–1)
Platelets: 381 10*3/uL (ref 140–400)
RBC: 2.83 10*6/uL — ABNORMAL LOW (ref 4.20–5.40)
RDW: 14 % (ref 12–15)
WBC: 7.61 10*3/uL (ref 3.50–10.80)

## 2014-11-19 LAB — SODIUM
Sodium: 133 mEq/L — ABNORMAL LOW (ref 136–145)
Sodium: 133 mEq/L — ABNORMAL LOW (ref 136–145)

## 2014-11-19 MED ORDER — POLYETHYLENE GLYCOL 3350 17 G PO PACK
17.0000 g | PACK | Freq: Every day | ORAL | Status: AC
Start: 2014-11-19 — End: 2014-12-04

## 2014-11-19 MED ORDER — SODIUM CHLORIDE 1 G PO TABS
1.0000 g | ORAL_TABLET | Freq: Three times a day (TID) | ORAL | 0 refills | Status: AC
Start: 2014-11-19 — End: 2014-12-19
  Filled 2014-11-19: qty 90, 30d supply, fill #0

## 2014-11-19 MED ORDER — SENNOSIDES-DOCUSATE SODIUM 8.6-50 MG PO TABS
2.0000 | ORAL_TABLET | Freq: Two times a day (BID) | ORAL | Status: AC
Start: 2014-11-19 — End: ?

## 2014-11-19 MED ORDER — DEXAMETHASONE 1 MG PO TABS
ORAL_TABLET | ORAL | 0 refills | Status: AC
Start: 2014-11-19 — End: 2014-11-22
  Filled 2014-11-19: qty 4, 3d supply, fill #0

## 2014-11-19 MED ORDER — CARBOXYMETHYLCELLULOSE SODIUM 0.5 % OP SOLN
1.0000 [drp] | Freq: Three times a day (TID) | OPHTHALMIC | Status: AC | PRN
Start: 2014-11-19 — End: ?

## 2014-11-19 MED ORDER — OXYCODONE HCL 10 MG PO TABS
10.0000 mg | ORAL_TABLET | ORAL | 0 refills | Status: DC | PRN
Start: 2014-11-19 — End: 2015-04-08
  Filled 2014-11-19: qty 30, 5d supply, fill #0

## 2014-11-19 NOTE — UM Notes (Signed)
Neurosurgery or Procedure [facility]    Episode Care Day #8 (11/19/14):  D/C home today with home heath  PCP f/u appointment scheduled for 11/25/14 at 8:45am  97.7, 77, 18, 113/56  SpO2 97%  Na 133

## 2014-11-19 NOTE — Progress Notes (Signed)
Referral sent to the [facility] [phone] to set up [facility] PT prior to discharge.       Sindy Messing, MSW  Care Coordinator  [facility]   Neuro NT7  Spectra: [phone]

## 2014-11-19 NOTE — Plan of Care (Addendum)
Problem: Safety  Goal: Patient will be free from injury during hospitalization  Outcome: Progressing    Problem: Pain  Goal: Patient's pain/discomfort is manageable  Outcome: Progressing    Comments:   Patient A/O X4, FC, Speech clear. VSS. No BM today but refused taking all stool softeners. Midline in the rt upper arm patent, intact, flushed with a good blood draw.  10:00 Sodium and CBC drawn, with a Na level of 133. Patient's pain managed Q4 with oxycodone. Cleared for discharge. Midline taken out.Safety. AVS discussed with the patient. Will continue to monitor the patient.

## 2014-11-19 NOTE — Progress Notes (Signed)
Patient AOx3, f/c, MAE, and VSS. Constant left thigh pain, worsens with movement. 1 person assist OOB. Also c/o headache 8/10 @ 0245. Took 0.5 Dilaudid (ordered dose 1mg ). Became nauseous immediately after Dilaudid given. PRN zofran then given with good effect. Given PRN Oxycodone Q4 around the clock, last dose at 0400. Patient states consistent oxycodone keeps her pain 3-4/10, which is manageable. Plan to d/c home later today.

## 2014-11-19 NOTE — Discharge Summary (Signed)
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   Few small liver hypodensities. --likely benign liver cysts  Gall bladder sludge.   Disposition:    Home with home PT/OT  Pending Results, Recommendations & Instructions to providers after discharge:     1. Micro / Labs / Path pending: Needs CBC and bmp asoutpt in one week.     Recent Labs - Last 2:         Recent Labs  Lab 11/19/14  1010 11/18/14  0942   WBC 7.61 7.63   HGB 8.6* 8.5*   HEMATOCRIT 25.7* 25.3*   PLATELETS 381 371         Recent Labs  Lab 11/17/14  1748 11/13/14  1006   PT 13.6 14.8   PT INR 1.0 1.2*   PTT  --  22*       Recent Labs  Lab 11/14/14  0533   ALKALINE PHOSPHATASE 87   BILIRUBIN, TOTAL <0.1*   BILIRUBIN, DIRECT 0.1   PROTEIN, TOTAL 5.0*   ALBUMIN 2.9*   ALT 10   AST (SGOT) 11        Recent Labs  Lab 11/19/14  1011 11/19/14  0356  11/17/14  0458  11/16/14  1737   SODIUM 133* 133* More results in Results Review 135* More results in Results Review 136   POTASSIUM  --   --   --  3.5  --  3.9   CHLORIDE  --   --   --  105  --  102   CO2  --   --   --  23  --  24   BUN  --   --   --  9.0  --  10.0   GLUCOSE  --   --   --  97  --  101*   CALCIUM  --   --   --  8.0*  --  8.8   More results in Results Review = values in this interval not displayed.          Recent Labs  Lab 11/15/14  1511   THYROID STIMULATING HORMONE 2.46            Procedures/Radiology performed:   Radiology: all results from this admission  Ct Abdomen Pelvis Wo Iv/ Wo Po Cont    11/14/2014     Redemonstration of a large left  groin hematoma, which appears slightly better organized compared to prior study as above. Active extravasation or relationship to adjacent vasculature cannot be evaluated due to lack of IV contrast administration.  Alexandria Spinner, MD  11/14/2014 3:03 PM     Ct Angiogram Head    11/12/2014    1. A stent is present within the right internal carotid siphon. 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 Additional details are discussed above. 2. Normal examination of the brain.  Alexandria Maidens, MD  11/12/2014 10:49 AM     Ct Head Wo Contrast    11/15/2014  Embolization coils and stent are seen in the region of the right cavernous sinus. Evaluation of the adjacent brain parenchyma is suboptimal due to marked streak artifact.  Within limitation, no acute abnormality.  Alexandria Kindred, MD  11/15/2014 2:30 AM     Ct Head W Wo Contrast    11/12/2014    1. A stent is present within the right internal carotid siphon. 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 Additional details are discussed above. 2. Normal examination of the brain.  Alexandria Maidens, MD  11/12/2014 10:49 AM     Ct Angiogram Chest    11/12/2014    1. Negative for pulmonary embolism. 2. Small focus of discoid atelectasis in the left lower lobe. 3. There is a large left groin hematoma with active extravasation of contrast material, possibly from the femoral vein.  Urgent findings were reported by telephone to Dr. Jearld Trevino.  Alexandria Birch, MD  11/12/2014 8:37 PM     Ct Abdomen Pelvis W Iv Contrast Only    11/13/2014    1. Negative for pulmonary embolism. 2. Small focus of discoid atelectasis in the left lower lobe. 3. There is a large left groin hematoma with active extravasation of contrast material, possibly from the femoral vein.  Urgent findings were reported by telephone to Dr. Jearld Trevino.  Alexandria Birch, MD  11/12/2014 8:37 PM     Xr Chest Ap Portable    11/12/2014    Low lung volume with small focus of left basilar atelectasis.  Alexandria Birch, MD  11/12/2014 8:02 PM     [place] Groin  Pseudoaneurysm Left W Dopp    11/13/2014     No evidence of left groin pseudoaneurysm or fistula.   7.6 cm left groin hematoma as described above.  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  Alexandria Crocker, MD  11/14/2014 2:32 PM     Surgery: all results from this admission  Procedure(s):  NEURO EMBOLIZATION (Right)     Hospital Course:     Reason for admission/ HPI/icu course: Alexandria Trevino is a 59 y.o. 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 Pt was admitted on 11/12/14 for elective coil embolization. Pt's post op course was complicated by left groin hematoma (H/H: 11/31 down to 9/27, no transfusion required). Pt is now stable for transfer to neuro tele. Pt complains of right eye pain but decreased overall. Some left eye irritation. Pain in left groin but not worsening. USg was negative for pseudoaneurysm.      Pt was transferred to floor. Pt had repeat CT A/P done on 8/6 slightly larger hematoma however coalescing and stable. Dr. Aleen Trevino was following her on the floor. H/H was closely monitored. She got a unit of blood transfusion on 8/10 for hb on 6.9. Her H/H was stable on the day of discharge hb was 8.6.   Her pain was controlled with oxycodone every 4 hours. She was on decadron for helping with inflammation associated with the procedure.   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 She was fluid restriction and salt tabs. Na on the day of discharge was  133.     PT/OT recommended home PT/OT   Discharge Day Exam:  Temp:  [97.2 F (36.2 C)-98.7 F (37.1 C)] 97.2 F (36.2 C)  Heart Rate:  [73-91] 76  Resp  Rate:  [18] 18  BP: (99-122)/(55-73) 115/62 mmHg  General: awake, alert, oriented x 3  Cardiovascular: regular rate and rhythm,   Lungs: clear to auscultation bilaterally.  Abdomen: soft, non-tender, non-distended; no palpable masses, no hepatosplenomegaly, normoactive bowel sounds, no rebound or guarding  Extremities: left groin swelling on the anterior thigh, significant bruising on the posterior, lateral thigh.       Consultations:   Neuro IR, Dr. Aleen Trevino.     Discharge Condition:   Stable.     Discharge Instructions & Follow Up Plan for Patient:     Diet:regular.     Activity/Weight Bearing Status:as tolerated       Patient was instructed to follow up with:     Follow-up Information     Follow up with Alexandria Reasons, MD. Schedule an appointment as soon as possible for a visit in 1 week.    Specialty:  Family Medicine    Contact information:    [address]  [place] [address]  [phone]          Follow up with Amedeo Kinsman, MD. Schedule an appointment as soon as possible for a visit in 1 week.    Specialty:  Diagnostic Radiology    Why:  Neuro IR.     Contact information:    [address]  Radiology  [facility] [facility] [address] [place] [address]  [phone]            Complete instructions and follow up are in the patient's After Visit Summary    Minutes spent coordinating discharge and reviewing discharge plan: 42 minutes    Discharge Medications:        Medication List      START taking these medications          carboxymethylcellulose (PF) 0.5 % ophthalmic solution   Commonly known as:  REFRESH PLUS   Place 1 drop into both eyes 3 (three) times daily as needed.       dexamethasone 1 MG tablet   Commonly known as:  DECADRON   1mg  q12 hrsx1d,1mg  daily x2d. stop       oxyCODONE 10 MG Tabs   Take 1 tablet (10 mg total) by mouth every 4 (four) hours as needed.       polyethylene glycol packet   Commonly known as:  MIRALAX   Take 17 g by mouth daily.       senna-docusate 8.6-50 MG per tablet      Commonly known as:  PERICOLACE   Take 2 tablets by mouth 2 (two) times daily.       sodium chloride 1 G tablet   Take 1 tablet (1 g total) by mouth 3 (three) times daily with meals.         CONTINUE taking these medications          albuterol 108 (90 BASE) MCG/ACT inhaler   Commonly known as:  PROVENTIL HFA;VENTOLIN HFA       aspirin EC 325 MG EC tablet       clopidogrel 75 mg tablet   Commonly known as:  PLAVIX       desonide 0.05 % cream   Commonly known as:  DESOWEN       famotidine 20 MG  tablet   Commonly known as:  PEPCID       fluticasone 50 MCG/ACT nasal spray   Commonly known as:  FLONASE       levothyroxine 88 MCG tablet   Commonly known as:  SYNTHROID, LEVOTHROID       triamcinolone 0.1 % cream   Commonly known as:  KENALOG       Vitamin D (Ergocalciferol) 50000 UNIT Caps   Commonly known as:  DRISDOL         Where to Get Your Medications     These are the prescriptions that you need to pick up.         You may get the following medications from any pharmacy   -  carboxymethylcellulose (PF) 0.5 % ophthalmic solution   -  dexamethasone 1 MG tablet   -  oxyCODONE 10 MG Tabs   -  polyethylene glycol packet   -  senna-docusate 8.6-50 MG per tablet   -  sodium chloride 1 G tablet                      [facility]  Department of Medicine  P: [phone]  F: [phone]    Signed by: Alexandria Booze, MD    CC: Alexandria Reasons, MD

## 2014-11-19 NOTE — Progress Notes (Signed)
PCP f/u appointment scheduled for 11/25/14 at 8:45am.  Dub Amis, MSW  [facility] Discharge Planner  # [phone]

## 2014-11-19 NOTE — Progress Notes (Signed)
Discharge Planner contacted [facility] and update provided regarding discharge order. [facility] reported that she will f/u with Pt.  Dub Amis, MSW  [facility] Discharge Planner  # [phone]

## 2014-11-19 NOTE — Progress Notes (Signed)
Discharge Planner met with Pt and her husband in her room/at bedside. Discharge order was discussed. Pt discharging home with HH. It was discussed that [facility] will f/u with coordinating service. Pt's husband to transport. Pt reported no present discharge concern needs.  Dub Amis, MSW  [facility] Discharge Planner  # [phone]

## 2014-11-20 ENCOUNTER — Telehealth: Payer: Self-pay | Admitting: Body Imaging

## 2014-11-20 NOTE — Progress Notes (Signed)
[facility] f/u with patient to discuss home PT, patient declined service, that she has no need for it. Deferral sent for home PT.

## 2014-11-20 NOTE — Telephone Encounter (Signed)
Spoke with pt this am who was complaining of SOB.  Listening on the phone you could tell she was having difficulty.  Thought it might be as a result of taking all 4 of her pills together. Seems anxious. This RN concerned that she might have a PE as a result of being in the hospital for one week.  Asked pt to go to closest ER and be evaluated.  Discussed with Dr Aleen Sells who agrees with the plan. Spoke to pt upon her return from the [facility]. CT of chest neg for PE. Noted some cysts on her liver, ultrasound done. Pt reports that she has had a BM.  Her Left groin is very bruised yet the puncture site is flat. Bruising noted down her leg which we discussed as gravity as she has become more mobile.  Encouraged heat to the groin at this point to enhance absorption and healing. Pt states that it has been 6 hours since her last pain med.  Her pain is located Right eye, # 2 pain.  Remains photo sensative. She wants to change her meds to Hydrocodone which she has at home.  She states that the oxycodone makes her sick and feel funny.  Told pt to try the hydrocodone, if that works great if not go back to the oxycodone.  Pt has two more tablets of Decadron until this med is complete

## 2014-12-17 ENCOUNTER — Telehealth: Payer: Self-pay | Admitting: Body Imaging

## 2014-12-17 NOTE — Telephone Encounter (Signed)
Spoke with pt today who states she is doing much better.  The groin hematoma is not longer purple.  She states still a bit sore but the swelling has gone way down.  Continues to use heating pad to groin at night.  States small lump noted. No signs of infection noted. States her eye has returned to normal as far as redness/swelling outward. Saw ophtho in [place] and he/she states the eye is improving.  She continues with double vision but much improved. No longer taking pain meds, hasn't for the last 3 weeks. States that her neck has really been bothering her and she is seeing a chiropractor getting ultrasound and electrical stimulation which seems to be helping the neck discomfort.  Denies neck manipulation by chiropractor.

## 2014-12-21 ENCOUNTER — Encounter: Payer: Self-pay | Admitting: Body Imaging

## 2014-12-22 ENCOUNTER — Encounter: Payer: Self-pay | Admitting: Body Imaging

## 2014-12-22 ENCOUNTER — Ambulatory Visit: Payer: BLUE CROSS/BLUE SHIELD | Attending: Body Imaging | Admitting: Body Imaging

## 2014-12-22 DIAGNOSIS — Z5189 Encounter for other specified aftercare: Secondary | ICD-10-CM | POA: Insufficient documentation

## 2015-01-06 ENCOUNTER — Encounter: Payer: Self-pay | Admitting: Body Imaging

## 2015-01-12 ENCOUNTER — Encounter: Payer: Self-pay | Admitting: Body Imaging

## 2015-03-11 ENCOUNTER — Encounter: Payer: Self-pay | Admitting: Body Imaging

## 2015-03-25 ENCOUNTER — Inpatient Hospital Stay
Admission: RE | Admit: 2015-03-25 | Discharge: 2015-03-25 | Disposition: A | Discharge status: Home / Self Care | Payer: Self-pay | Source: Ambulatory Visit

## 2015-03-25 ENCOUNTER — Other Ambulatory Visit: Payer: Self-pay

## 2015-03-26 ENCOUNTER — Encounter: Payer: Self-pay | Admitting: Body Imaging

## 2015-04-08 ENCOUNTER — Ambulatory Visit: Payer: BLUE CROSS/BLUE SHIELD | Attending: Body Imaging | Admitting: Body Imaging

## 2015-04-08 ENCOUNTER — Encounter: Payer: Self-pay | Admitting: Body Imaging

## 2015-04-08 DIAGNOSIS — Z538 Procedure and treatment not carried out for other reasons: Secondary | ICD-10-CM | POA: Insufficient documentation

## 2015-04-13 ENCOUNTER — Encounter: Payer: Self-pay | Admitting: Body Imaging

## 2015-04-20 ENCOUNTER — Telehealth: Payer: Self-pay | Admitting: Body Imaging

## 2015-04-20 NOTE — Telephone Encounter (Signed)
Pt sent an email requesting to know if when her Plavix was complete (6 months) if she could also stop her aspirin.  After discussion with Dr Aleen Sells pt was informed by email that she would need to continue aspirin indefinitely after [facility] implant.  Pt replied that she understood.

## 2015-10-28 ENCOUNTER — Encounter: Payer: Self-pay | Admitting: Body Imaging

## 2016-01-06 ENCOUNTER — Other Ambulatory Visit: Payer: Self-pay

## 2016-01-06 ENCOUNTER — Ambulatory Visit
Admission: RE | Admit: 2016-01-06 | Discharge: 2016-01-06 | Disposition: A | Discharge status: Home / Self Care | Payer: Self-pay | Source: Ambulatory Visit

## 2016-01-13 ENCOUNTER — Encounter: Payer: Self-pay | Admitting: Body Imaging

## 2016-12-27 ENCOUNTER — Encounter: Payer: Self-pay | Admitting: Body Imaging

## 2017-01-08 ENCOUNTER — Ambulatory Visit
Admission: RE | Admit: 2017-01-08 | Discharge: 2017-01-08 | Disposition: A | Discharge status: Home / Self Care | Payer: Self-pay | Source: Ambulatory Visit

## 2017-01-08 ENCOUNTER — Other Ambulatory Visit: Payer: Self-pay

## 2017-01-11 ENCOUNTER — Telehealth: Payer: Self-pay | Admitting: Body Imaging

## 2017-01-11 NOTE — Telephone Encounter (Signed)
Spoke with pt to give her the results of her MRA as stable occlusion per Dr Aleen Sells.  She wanted [place] to know that for the last month and half she has the sharp episodic pain behind her right eye which she feels is probably from "eye strain" this typically happens after watching TV or working on the computer.  She states that when she gets this pain she will lie down and use an ice pack and it goes away.  She denies chemosis, proptosis or vision changes

## 2017-01-15 ENCOUNTER — Telehealth: Payer: Self-pay | Admitting: Body Imaging

## 2017-01-15 NOTE — Telephone Encounter (Signed)
Spoke with Dr Aleen Sells about pts symptoms and he suggested that we do an angiogram to confirm what we are seeing on the MRA (usual protocol) which will also address any other vascular issues.  I contacted the pt today.  She will need to call back as she is in the middle of a computer issue right now and is unable to discuss or schedule at this time

## 2017-01-29 ENCOUNTER — Other Ambulatory Visit: Payer: Self-pay | Admitting: Body Imaging

## 2017-01-29 DIAGNOSIS — I671 Cerebral aneurysm, nonruptured: Secondary | ICD-10-CM | POA: Insufficient documentation

## 2017-02-09 ENCOUNTER — Encounter: Payer: Self-pay | Admitting: Body Imaging

## 2017-02-19 ENCOUNTER — Telehealth: Payer: Self-pay | Admitting: Body Imaging

## 2017-02-19 NOTE — Telephone Encounter (Signed)
Pt called today cancelling her procedure for tomorrow.  She says that she needs to wait until April when the last procedure has been paid off. " I'm doing fine but I didn't realize how much this was going to cost and I need to wait"  Explained why we have ordered the angiogram which she understands but she needs to wait. Will call if anything changes

## 2017-02-20 ENCOUNTER — Ambulatory Visit: Admission: RE | Admit: 2017-02-20 | Payer: BLUE CROSS/BLUE SHIELD | Source: Ambulatory Visit | Admitting: Body Imaging

## 2017-02-20 ENCOUNTER — Encounter: Admission: RE | Payer: Self-pay | Source: Ambulatory Visit

## 2017-02-20 SURGERY — CEREBRAL ARTERIOGRAM
Anesthesia: Conscious Sedation | Site: Head

## 2020-01-23 ENCOUNTER — Encounter: Payer: Self-pay | Admitting: Diagnostic Radiology

## 2021-01-24 IMAGING — CT CT ABDOMEN PELVIS WITHOUT CONTRAST
2 of 3 series · 17 of 46 positions shown, 19 images · non-contrast
Comparison: None available.

Rlq pain
FINAL REPORT:
CT ABDOMEN PELVIS WITHOUT CONTRAST
INDICATION: RLQ abdominal pain (Age >= 14y)
TECHNIQUE: CT imaging was obtained from the lung bases to the pubic symphysis without intravenous contrast.  All CT scans at this facility use dose modulation and/or weight based dosing when appropriate to reduce radiation dose to as low as reasonably achievable. (#[id number]#)

[Series 2: renal stone · axial · 0.98mm/px · z∈[-472,-44]mm · 14 of 197 slices shown, 16 images]
[im 13/197  soft-tissue]
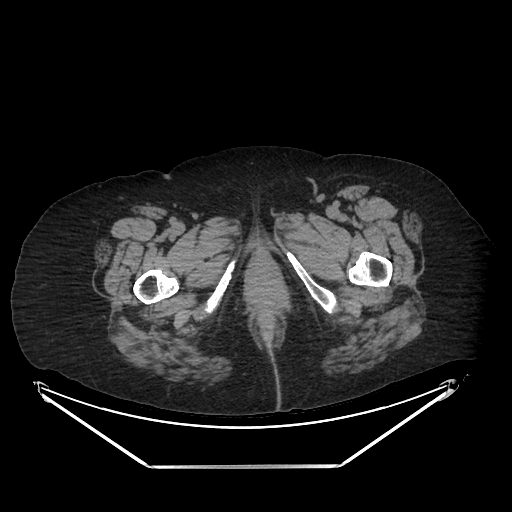
[im 13/197  bone]
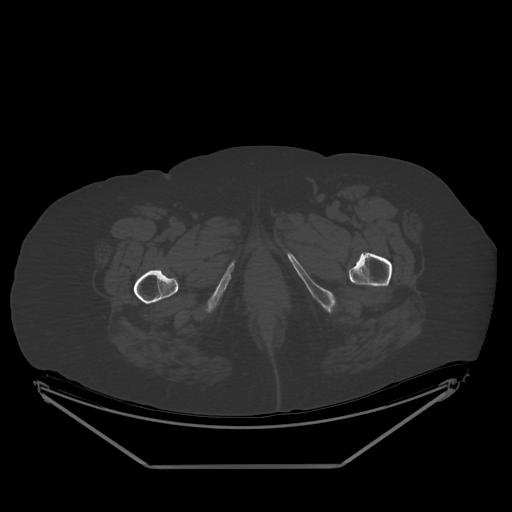
[im 26/197  soft-tissue]
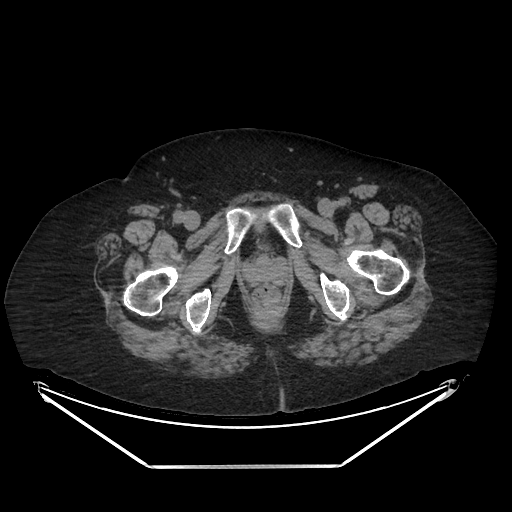
[im 38/197  soft-tissue]
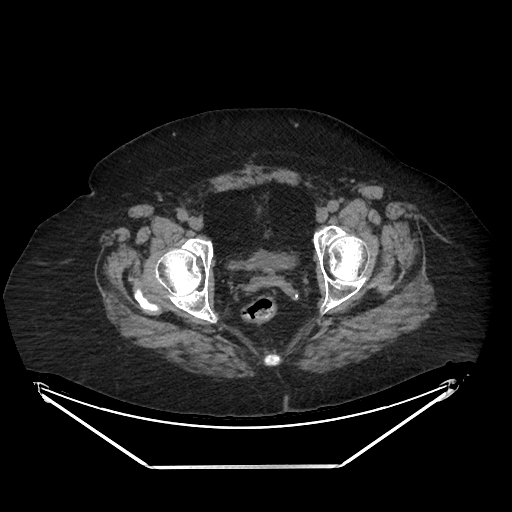
[im 51/197  soft-tissue]
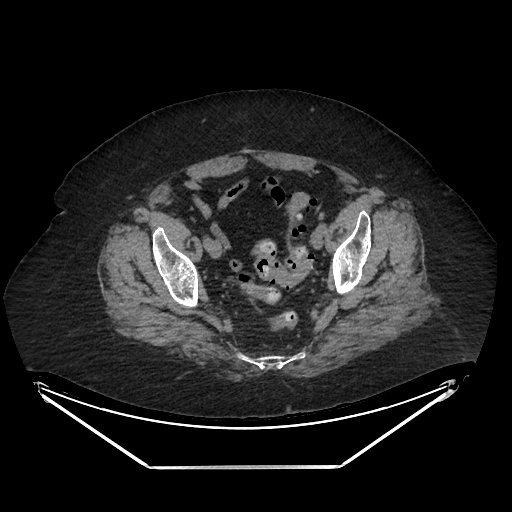
[im 64/197  soft-tissue]
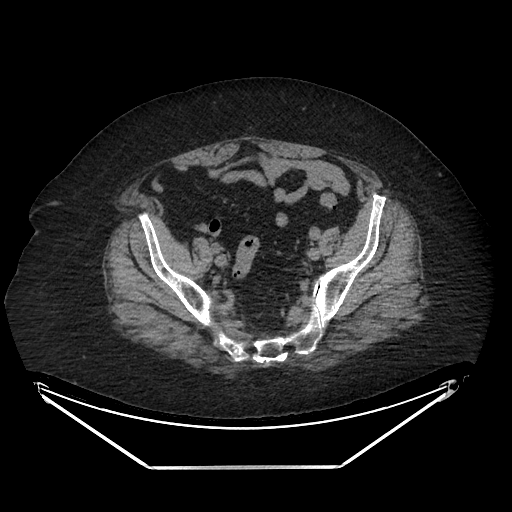
[im 76/197  soft-tissue]
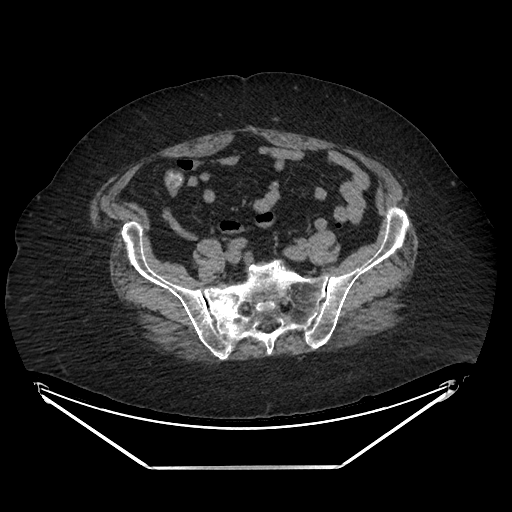
[im 89/197  soft-tissue]
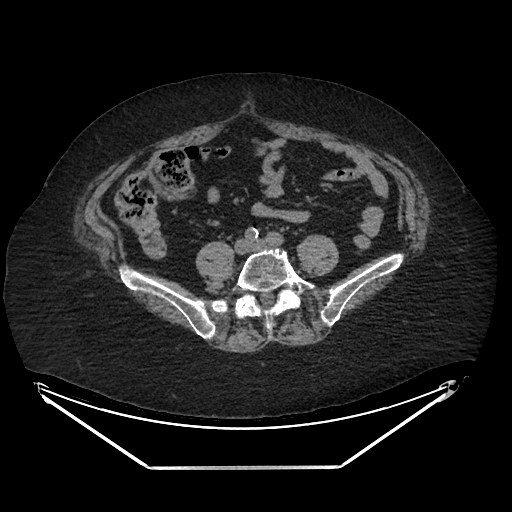
[im 108/197  soft-tissue]
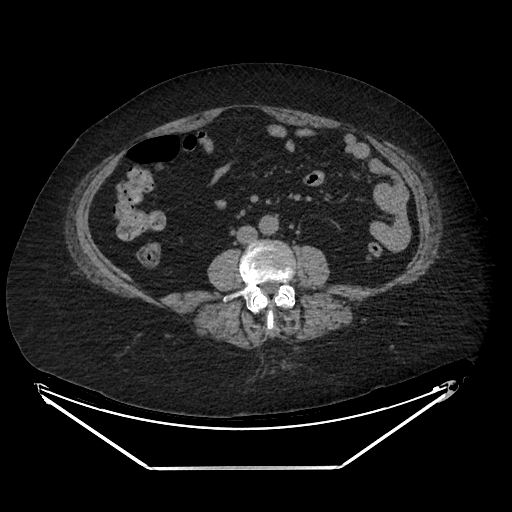
[im 121/197  soft-tissue]
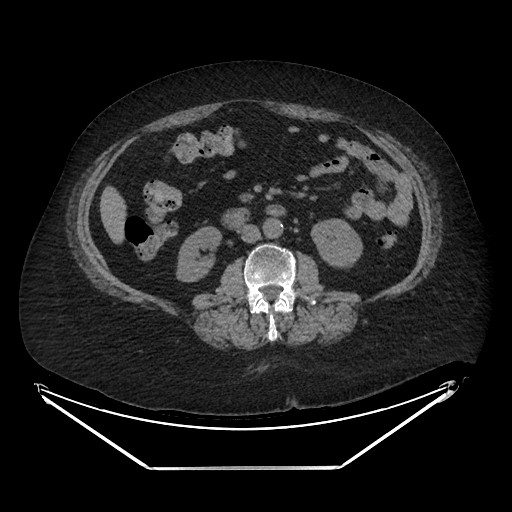
[im 121/197  bone]
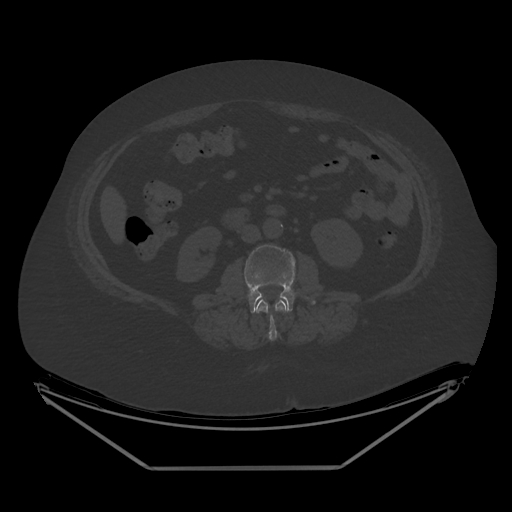
[im 133/197  soft-tissue]
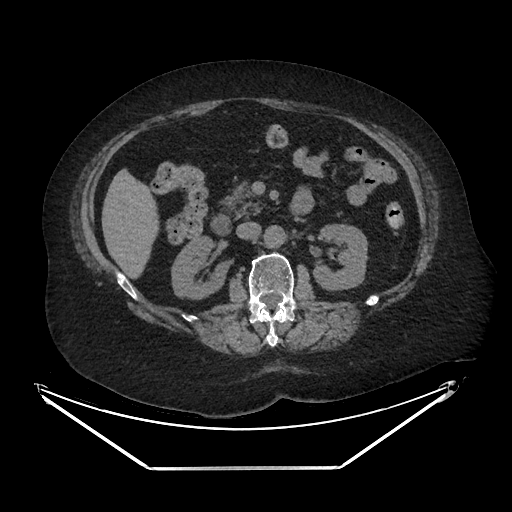
[im 146/197  soft-tissue]
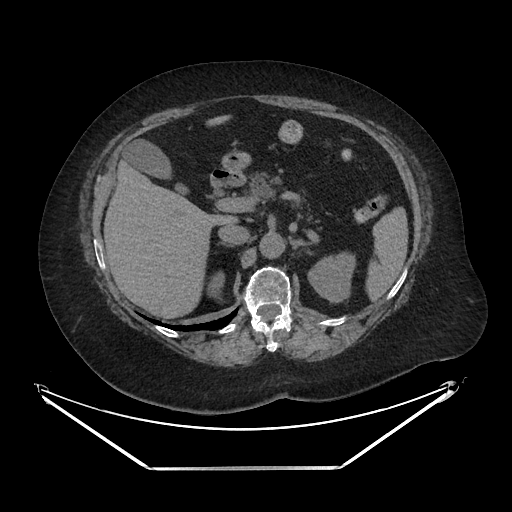
[im 159/197  soft-tissue]
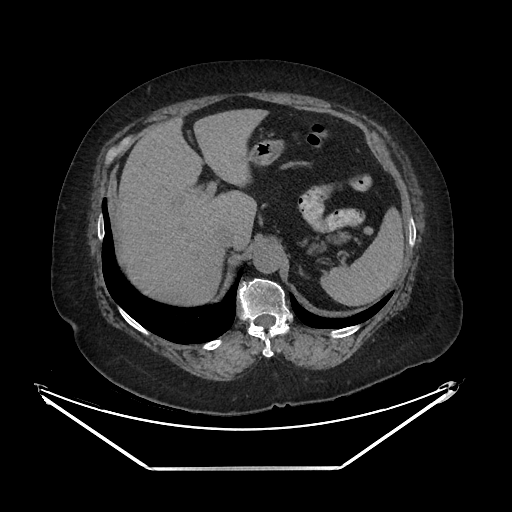
[im 171/197  soft-tissue]
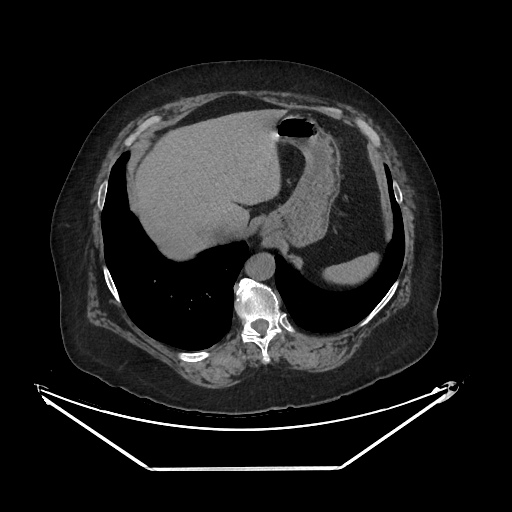
[im 184/197  soft-tissue]
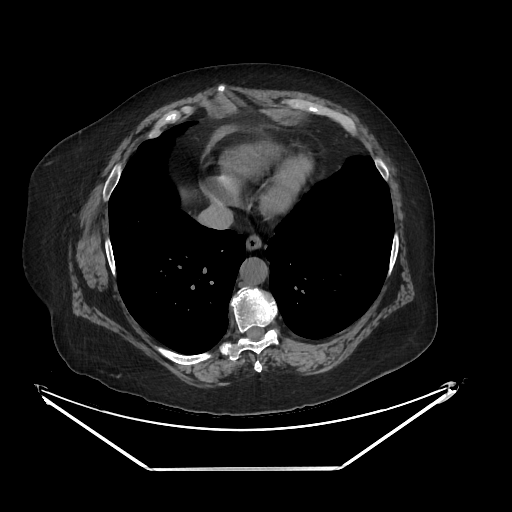

[Series 602: sag standard 2x2 · sagittal · 0.98mm/px · 3 of 243 slices shown]
[im 81/243  soft-tissue]
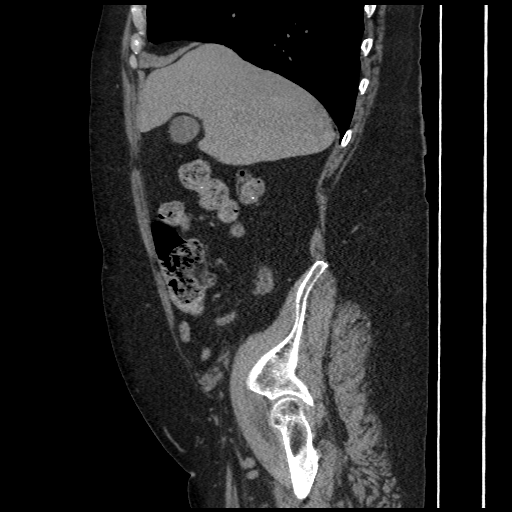
[im 108/243  soft-tissue]
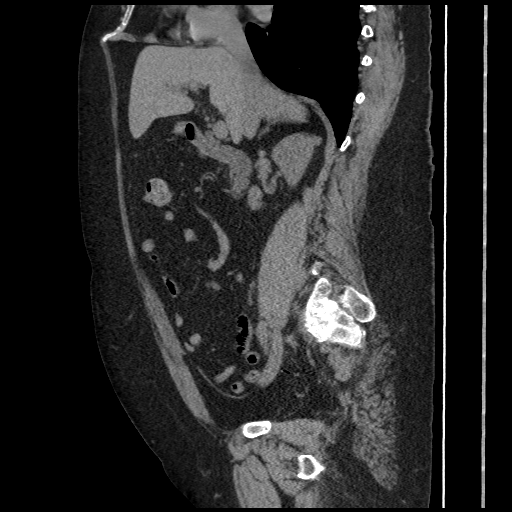
[im 135/243  soft-tissue]
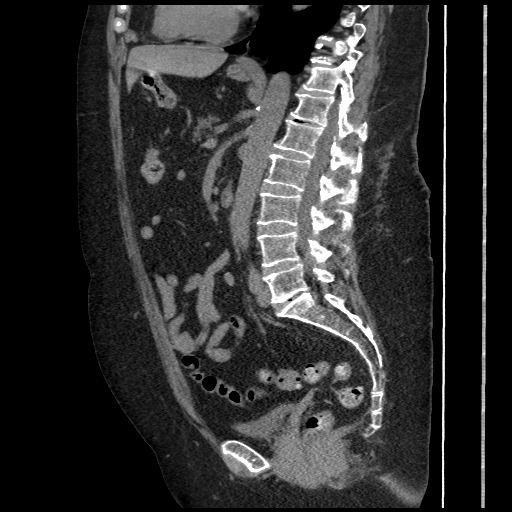

[17 of 46 positions shown; findings below may reference images not displayed]

FINDINGS: Solid organ and vascular assessment limited due to lack of intravenous contrast.
Lower thorax: Mild basilar atelectasis. The heart appears normal.
Liver: Multiple hepatic cysts.
Gallbladder/biliary: Normal.
Spleen: Normal.
Pancreas: Fatty atrophy without mass or ductal dilatation.
Adrenal glands: Normal.
Kidneys: Normal.
Abdominal wall: Normal.
Vascular: Minimal atherosclerotic plaque. No significant aneurysm.
Mesentery: No adenopathy, free air, or free fluid.
Stomach/bowel: The stomach and small bowel appear normal. No evidence of appendicitis. Scattered colonic diverticula without evidence of diverticulitis.
Reproductive organs: Hysterectomy.
Bladder: Decompressed.
Pelvic nodes: No adenopathy.
Bones: Right zone 1 sacral insufficiency fracture (series 2 image 122).
IMPRESSION: No acute findings in the abdomen or pelvis. The appendix is normal.
Right zone 1 sacral insufficiency fracture.

## 2021-02-17 IMAGING — US US ABDOMEN RUQ
1 series · 14 of 25 positions shown · non-contrast
Comparison: None
Exam is technically difficult due to body habitus and bowel gas.

FINAL REPORT:
Right upper quadrant ultrasound:
CLINICAL INDICATION: Abnormal serum enzymes

[Series 1: us abdomen ruq · 14 of 75 slices shown]
[im 1/75]
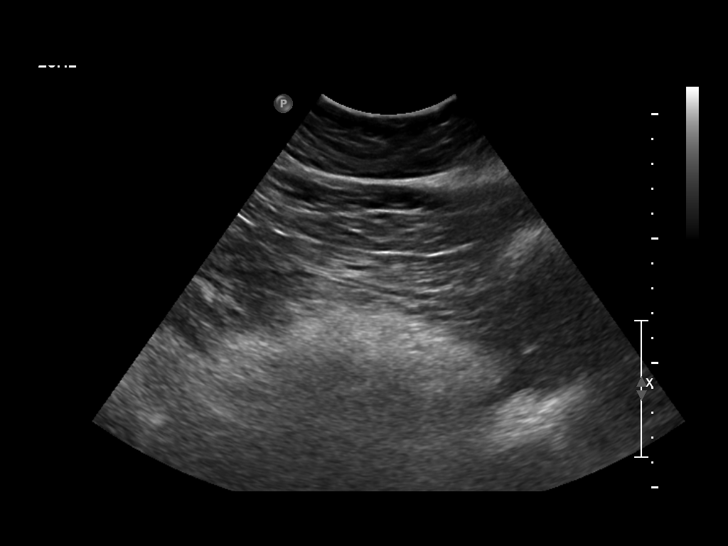
[im 7/75]
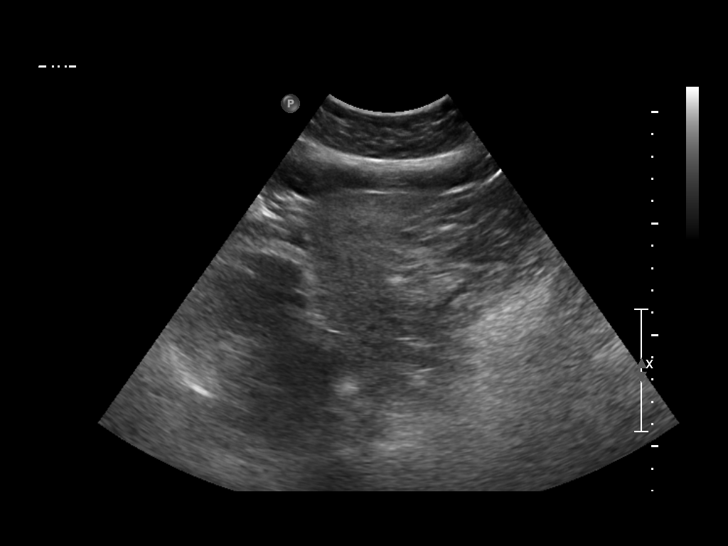
[im 13/75]
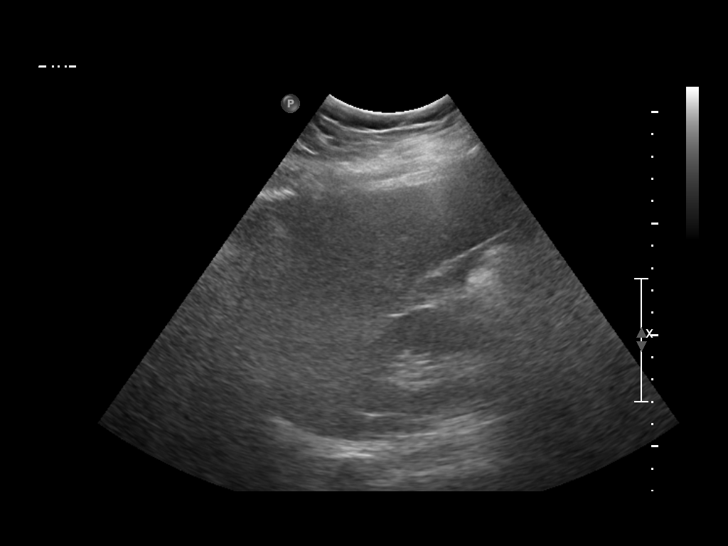
[im 19/75]
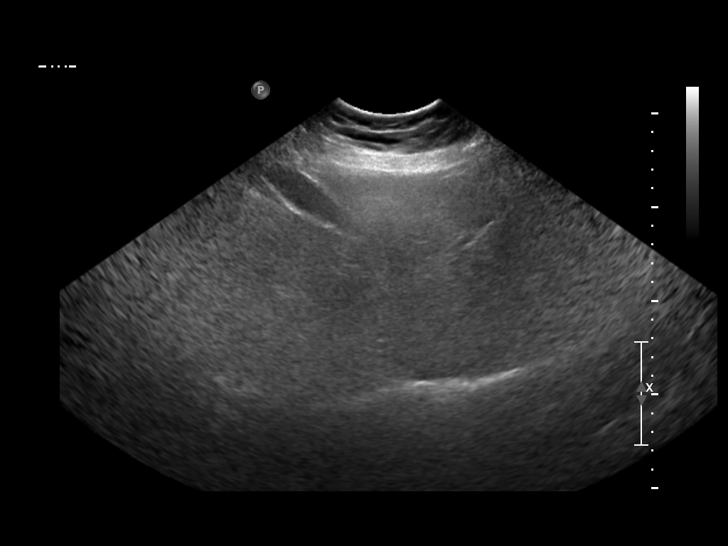
[im 25/75]
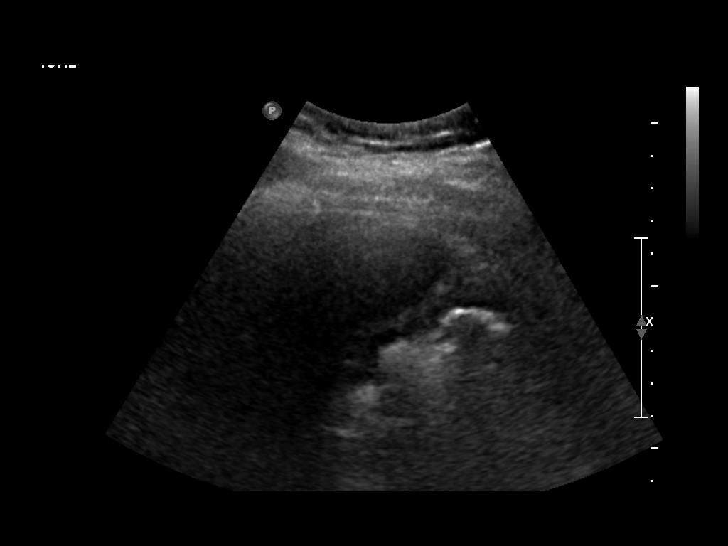
[im 28/75]
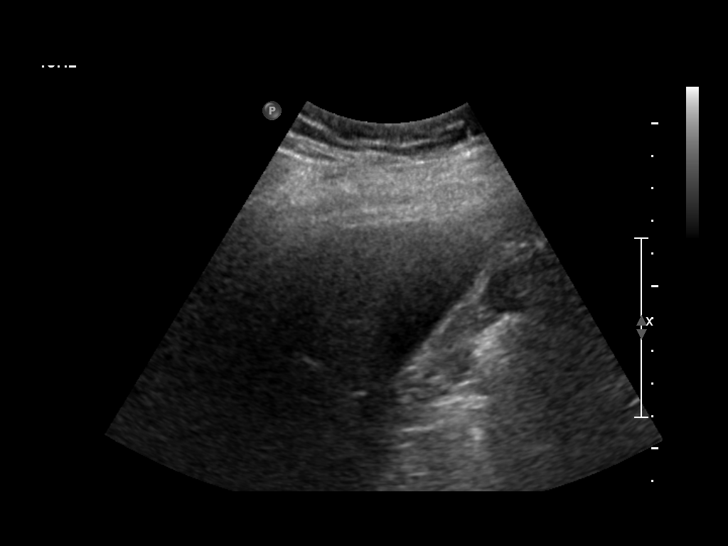
[im 34/75]
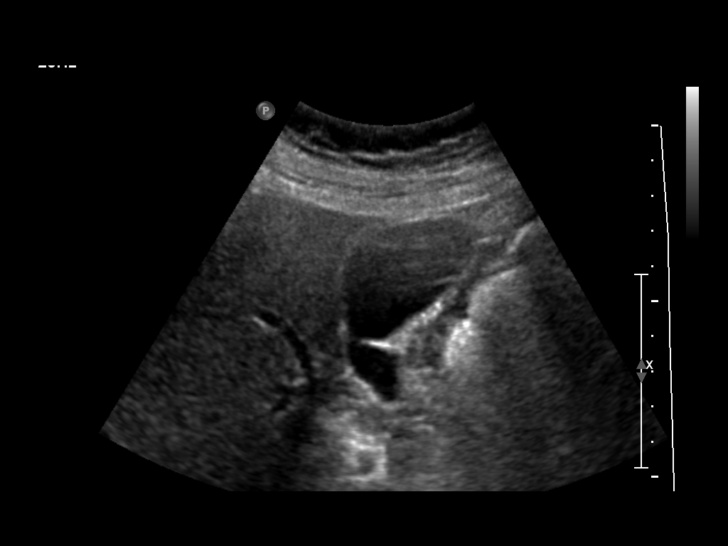
[im 41/75]
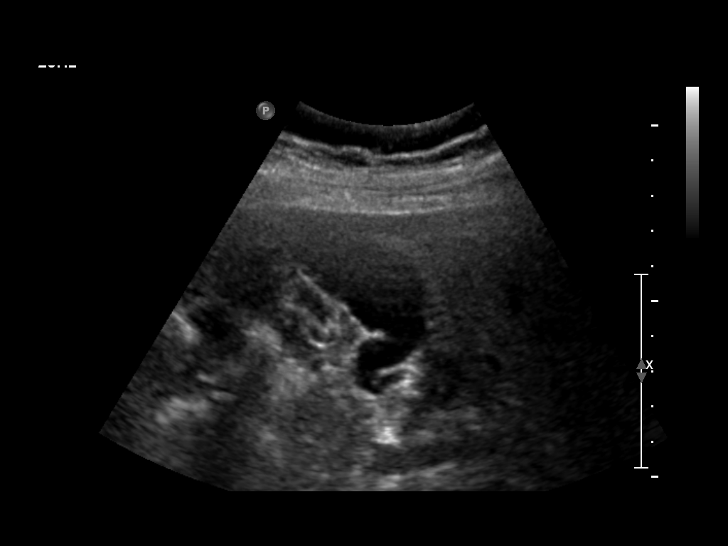
[im 47/75]
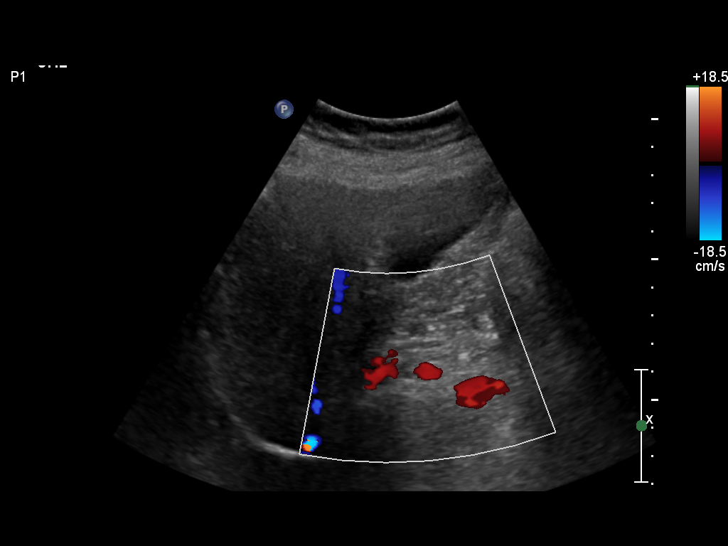
[im 50/75]
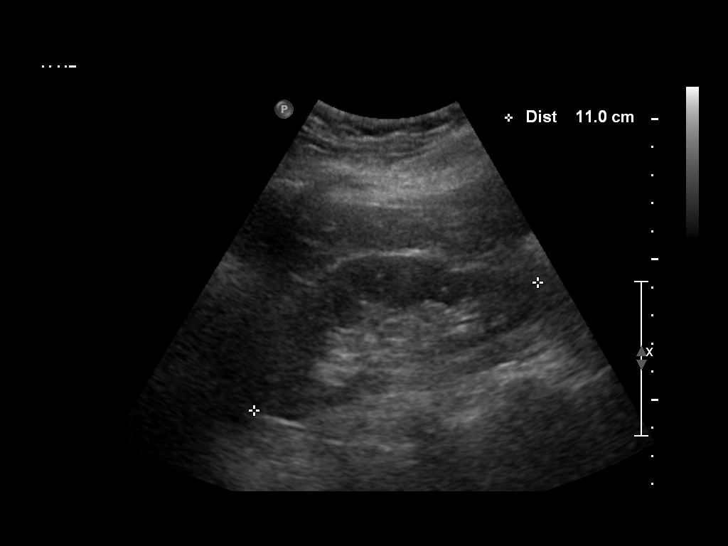
[im 56/75]
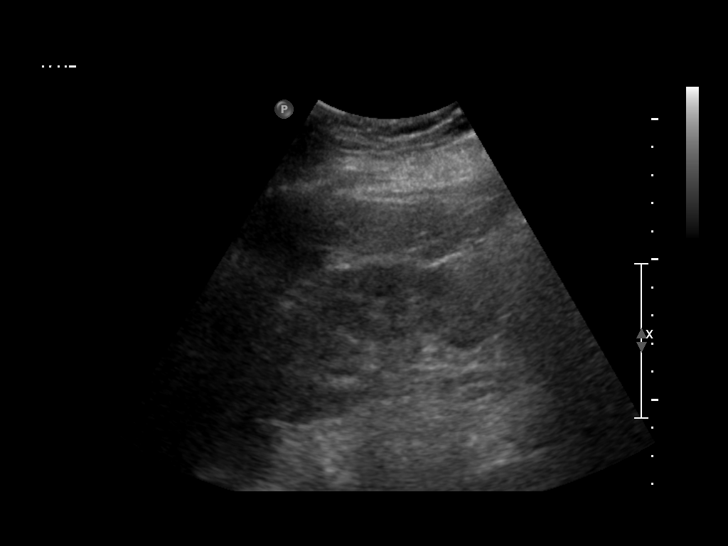
[im 62/75]
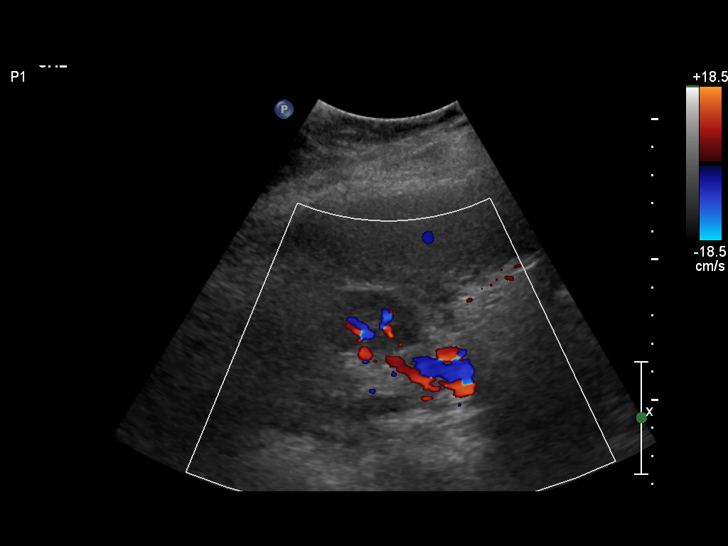
[im 68/75]
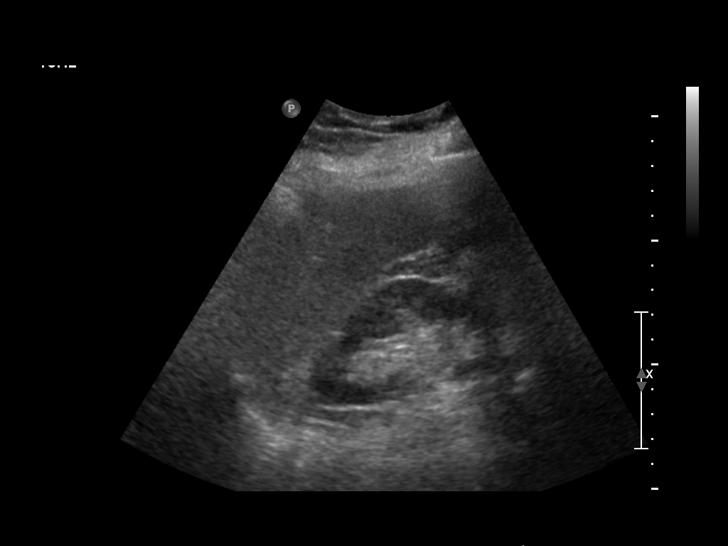
[im 75/75]
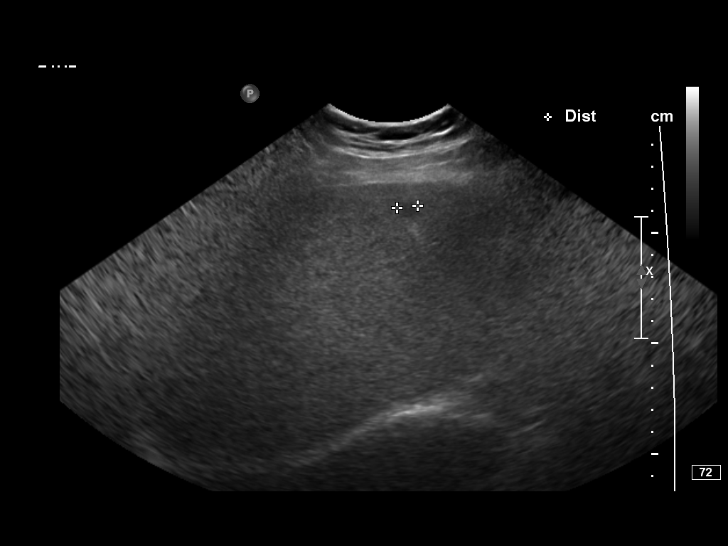

[14 of 25 positions shown; findings below may reference images not displayed]

The liver is enlarged. The liver is diffusely hyperechoic which can be seen with fatty infiltration. There is no clear indication of intrahepatic ductal dilation. A small cyst is demonstrated in the anterior aspect of the liver measuring 9 mm. No other liver lesion is demonstrated. Parenchymal detail however is limited. The common bile duct is within normal limits at 0millimeters. The gallbladder is sonographically normal. No pancreatic mass is seen. The right kidney is unremarkable measuring 11.0 centimeters in length.
IMPRESSION: Hepatic fatty infiltration with hepatomegaly.
Technically difficult examination. Only one liver cyst from previous CT is demonstrated sonographically.

## 2021-02-28 IMAGING — MR MRI BRAIN WITHOUT CONTRAST
5 of 11 series · 27 of 48 positions shown · non-contrast
Comparison: None.

Mental status change. Pt has had previous surgery for anneurysm with a coil inplanted.  Pt as a result is blind.
FINAL REPORT:
MRI BRAIN WITHOUT CONTRAST
INDICATION: Altered mental status. History of aneurysm coiling.
TECHNIQUE: Multiplanar multisequence MR imaging of the brain was performed without contrast.

[Series 5001: T1 · sagittal · 5.0mm · 0.40mm/px · 4 of 27 slices shown (1 of 2)]
[im 1/27]
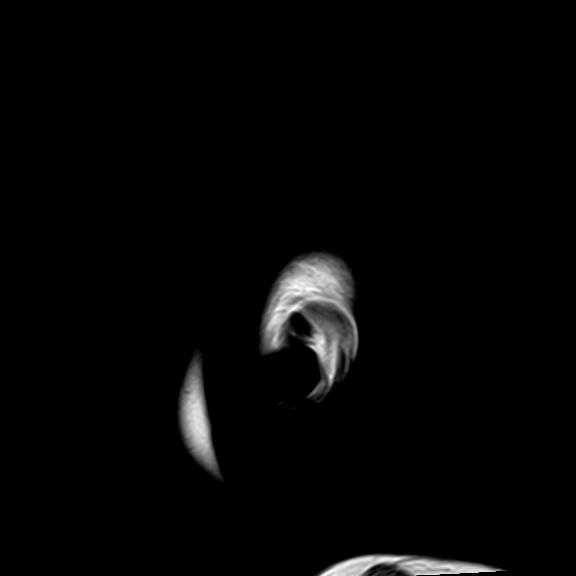
[im 9/27]
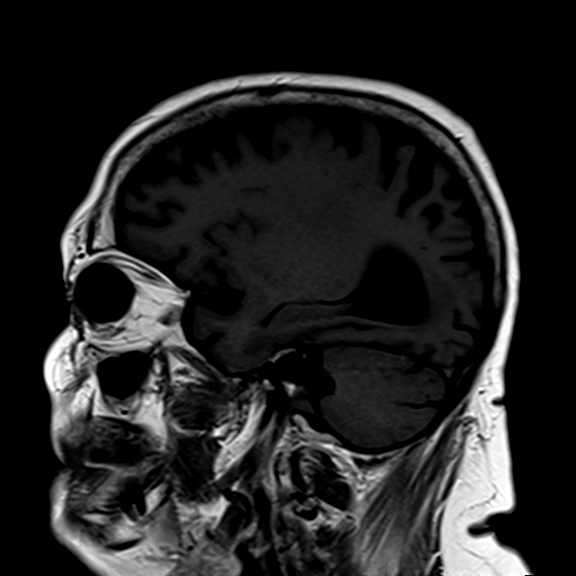
[im 18/27]
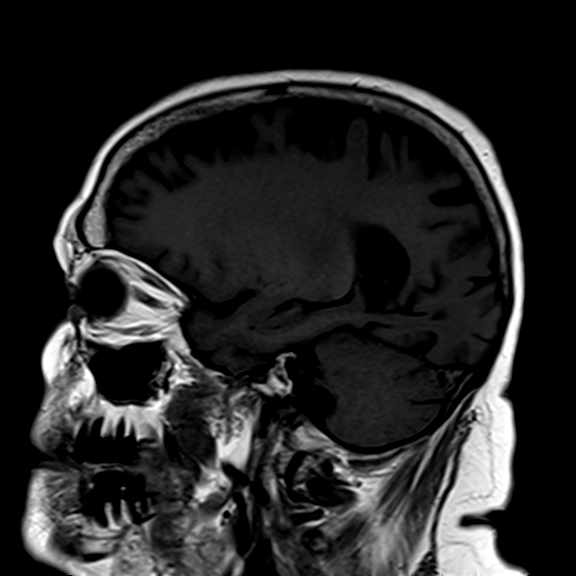
[im 27/27]
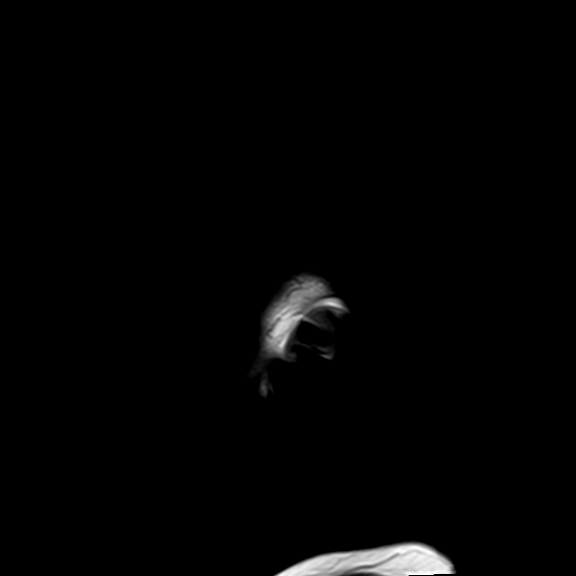

[Series 9001: T2 · axial · 4.0mm · 0.53mm/px · z∈[-86,+74]mm · 6 of 33 slices shown]
[im 1/33]
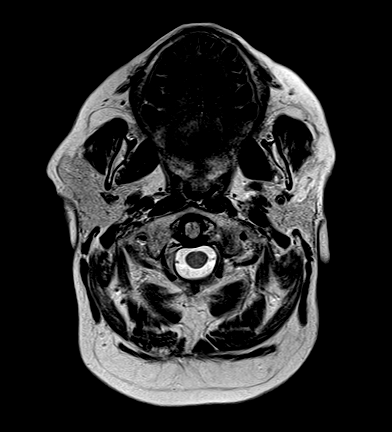
[im 7/33]
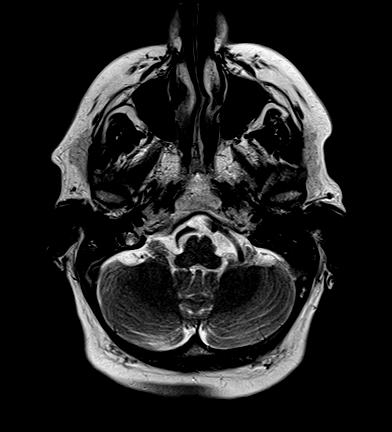
[im 13/33]
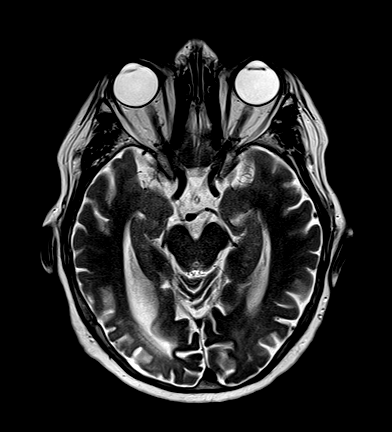
[im 20/33]
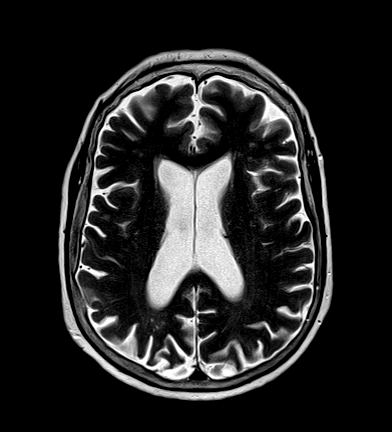
[im 26/33]
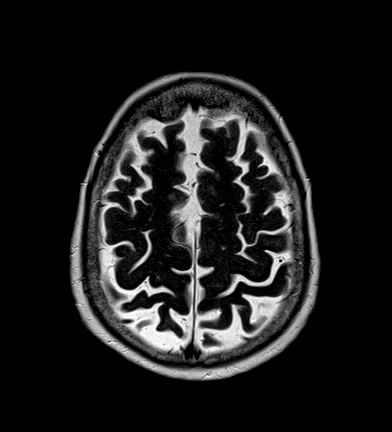
[im 33/33]
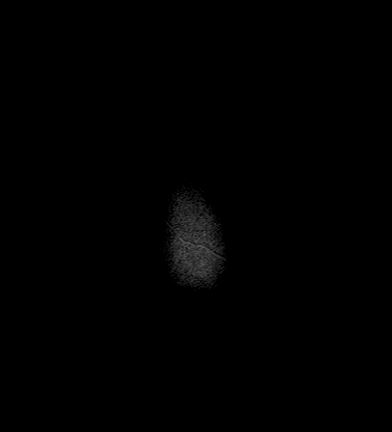

[T1 · axial · 4.0mm · 0.72mm/px · z∈[-86,+39]mm · 5 of 33 slices shown (2 of 2)]
[im 1/33]
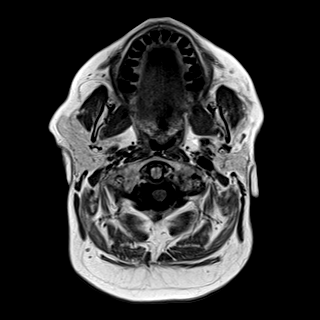
[im 7/33]
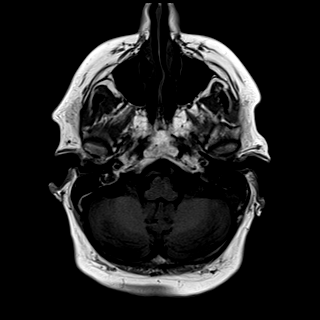
[im 13/33]
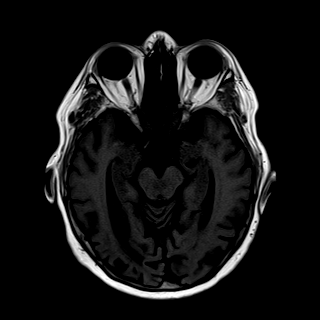
[im 20/33]
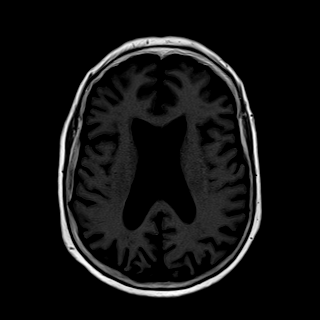
[im 26/33]
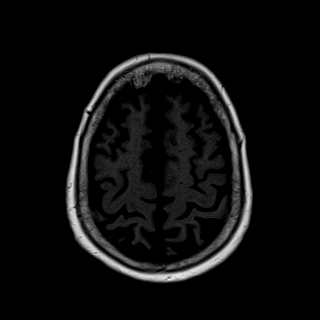

[FLAIR · axial · 4.0mm · 0.72mm/px · z∈[-86,+74]mm · 6 of 33 slices shown]
[im 1/33]
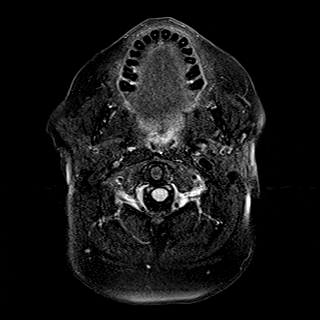
[im 7/33]
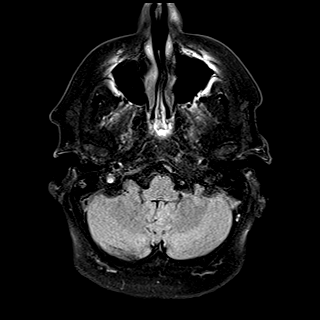
[im 13/33]
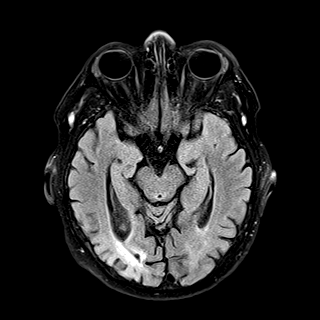
[im 20/33]
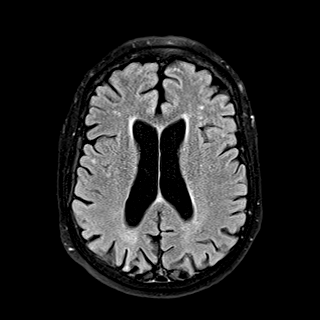
[im 26/33]
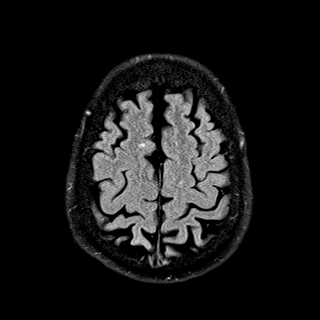
[im 33/33]
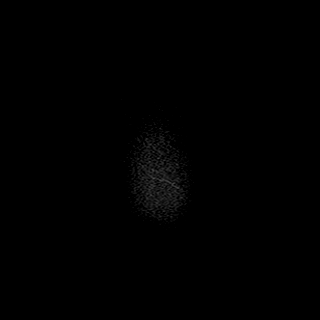

[GRE · axial · 4.0mm · 0.45mm/px · z∈[-86,+74]mm · 6 of 33 slices shown]
[im 1/33]
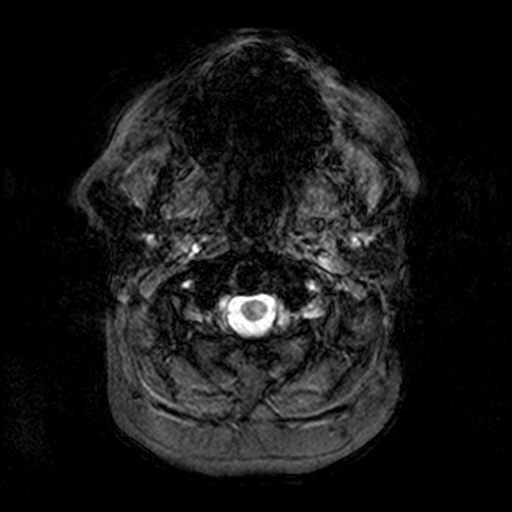
[im 7/33]
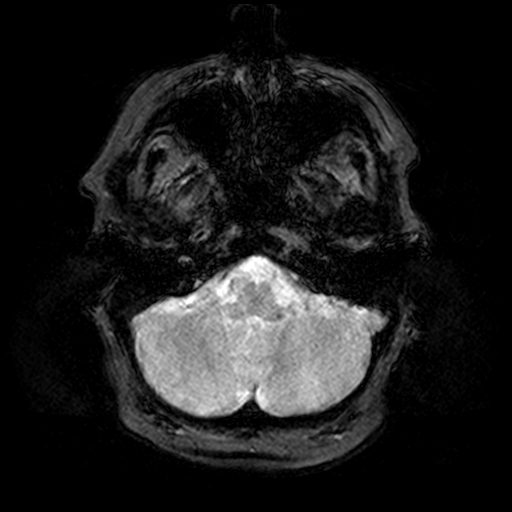
[im 13/33]
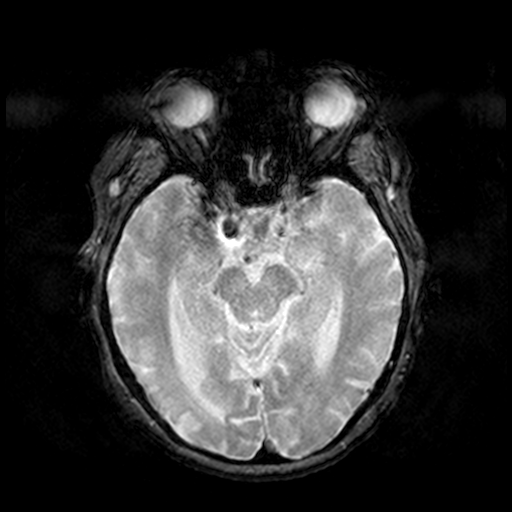
[im 20/33]
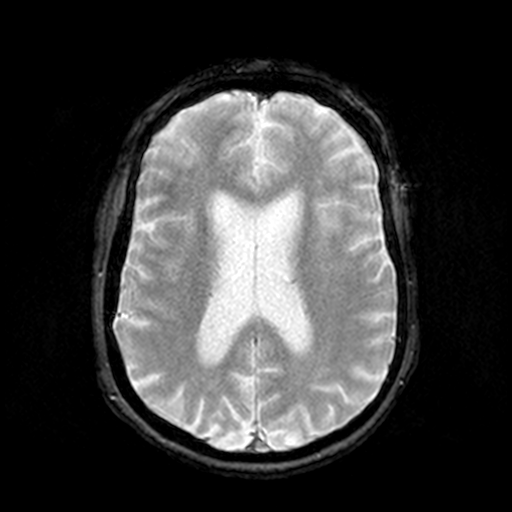
[im 26/33]
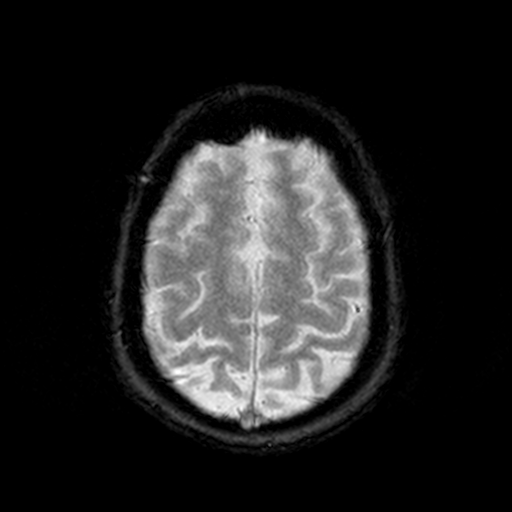
[im 33/33]
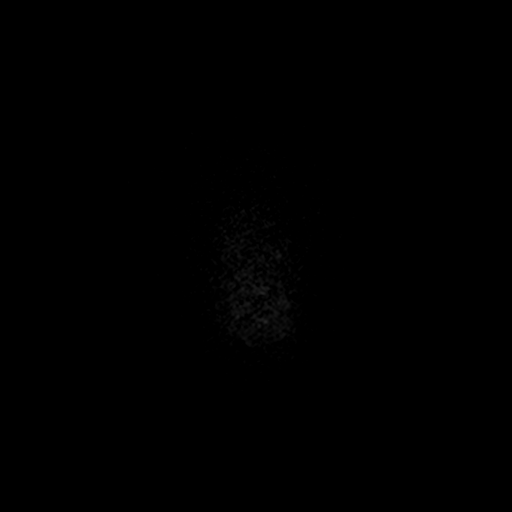

[27 of 48 positions shown; findings below may reference images not displayed]

FINDINGS: There are involutional changes slightly greater than expected for age. No restricted diffusion to suggest acute infarct. No abnormal susceptibility artifact, mass effect, or hydrocephalus. Scattered periventricular and subcortical T2/FLAIR hyperintensities which probably represent chronic hypertensive or microangiopathic changes.
The basal cisterns are patent. No cerebellar tonsillar herniation. The sellar and suprasellar structures are unremarkable. No extra-axial collections.
Cataract lens replacement bilaterally. Small amount of fluid in the mastoid air cells. Mild mucosal thickening in the ethmoid air cells. No suspicious marrow lesions. The major vascular flow voids are preserved.
IMPRESSION: 
IMPRESSION: No MR evidence of an acute intracranial process.

## 2021-04-15 IMAGING — DX XR CHEST 1 VIEW
1 series · 2 of 2 positions shown · non-contrast
Comparison: None available

FINAL REPORT:
HISTORY: Post procedure
Number of views:1

[Series 8809: AP · U · 0.10mm/px · 2 of 2 slices shown]
[im 1/2]
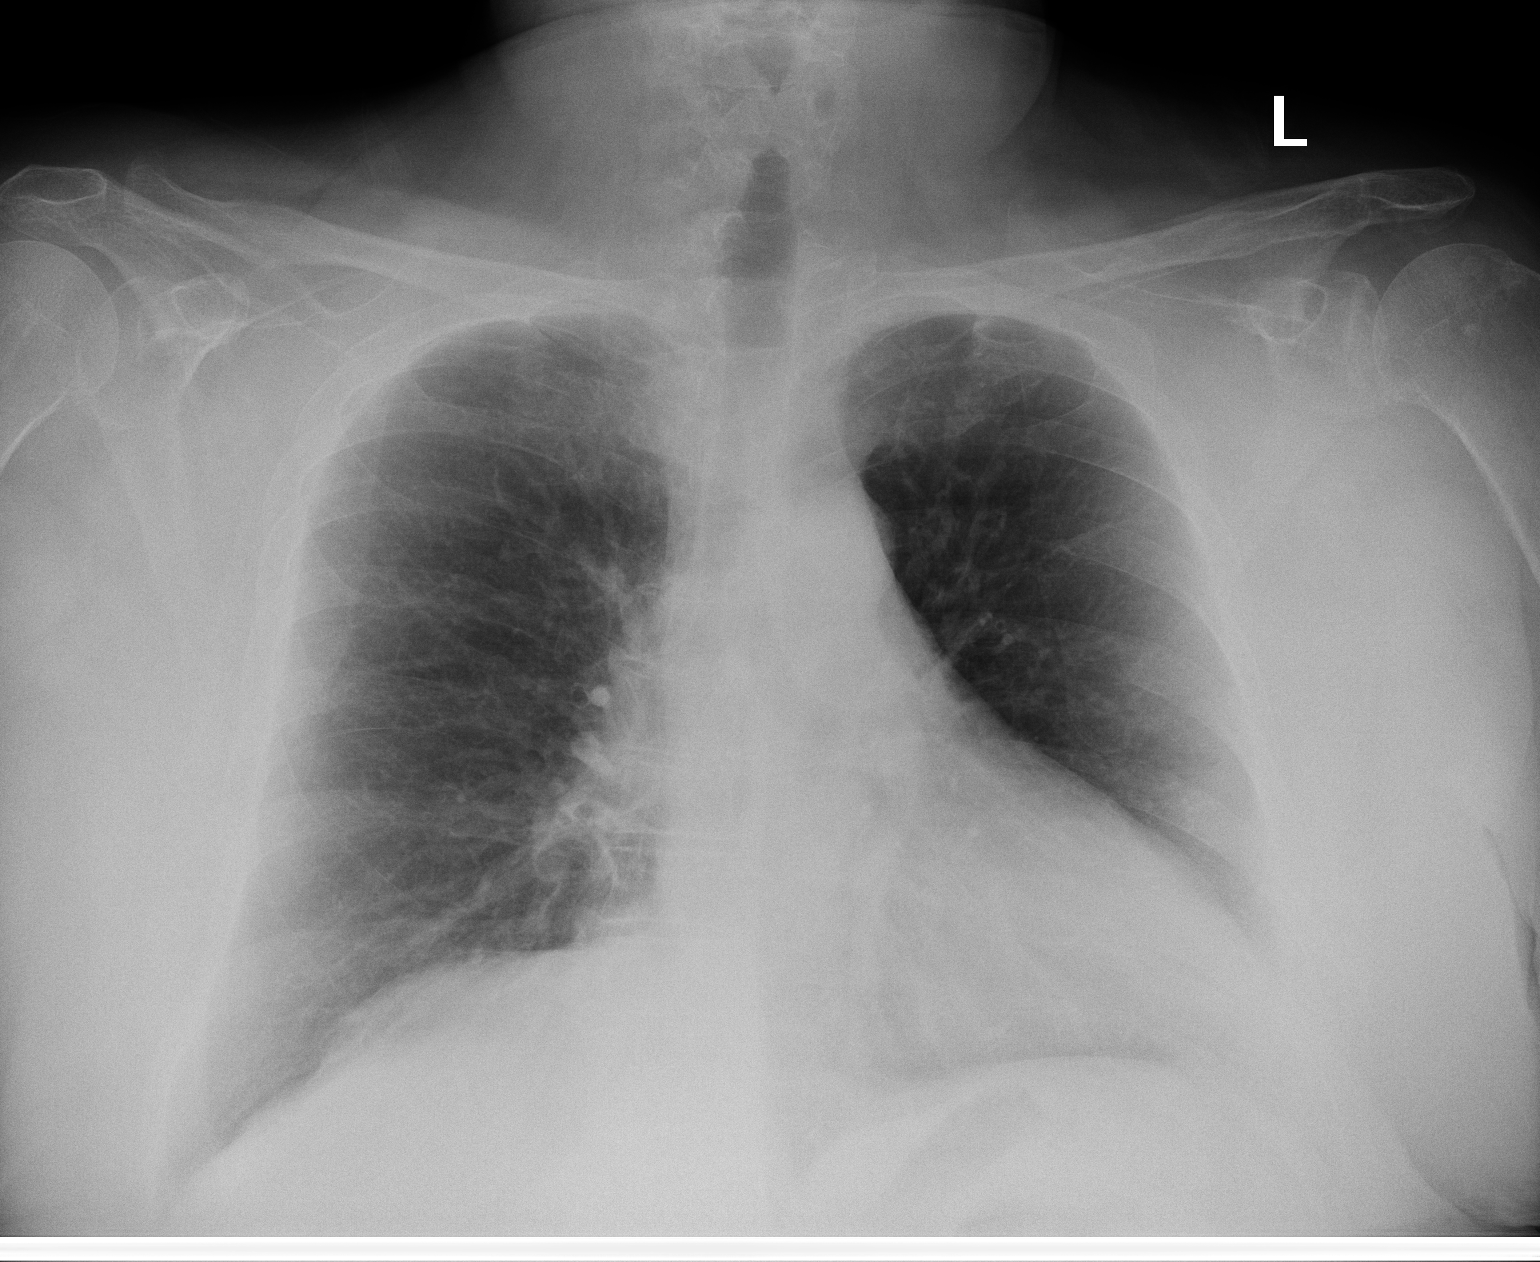
[im 2/2]
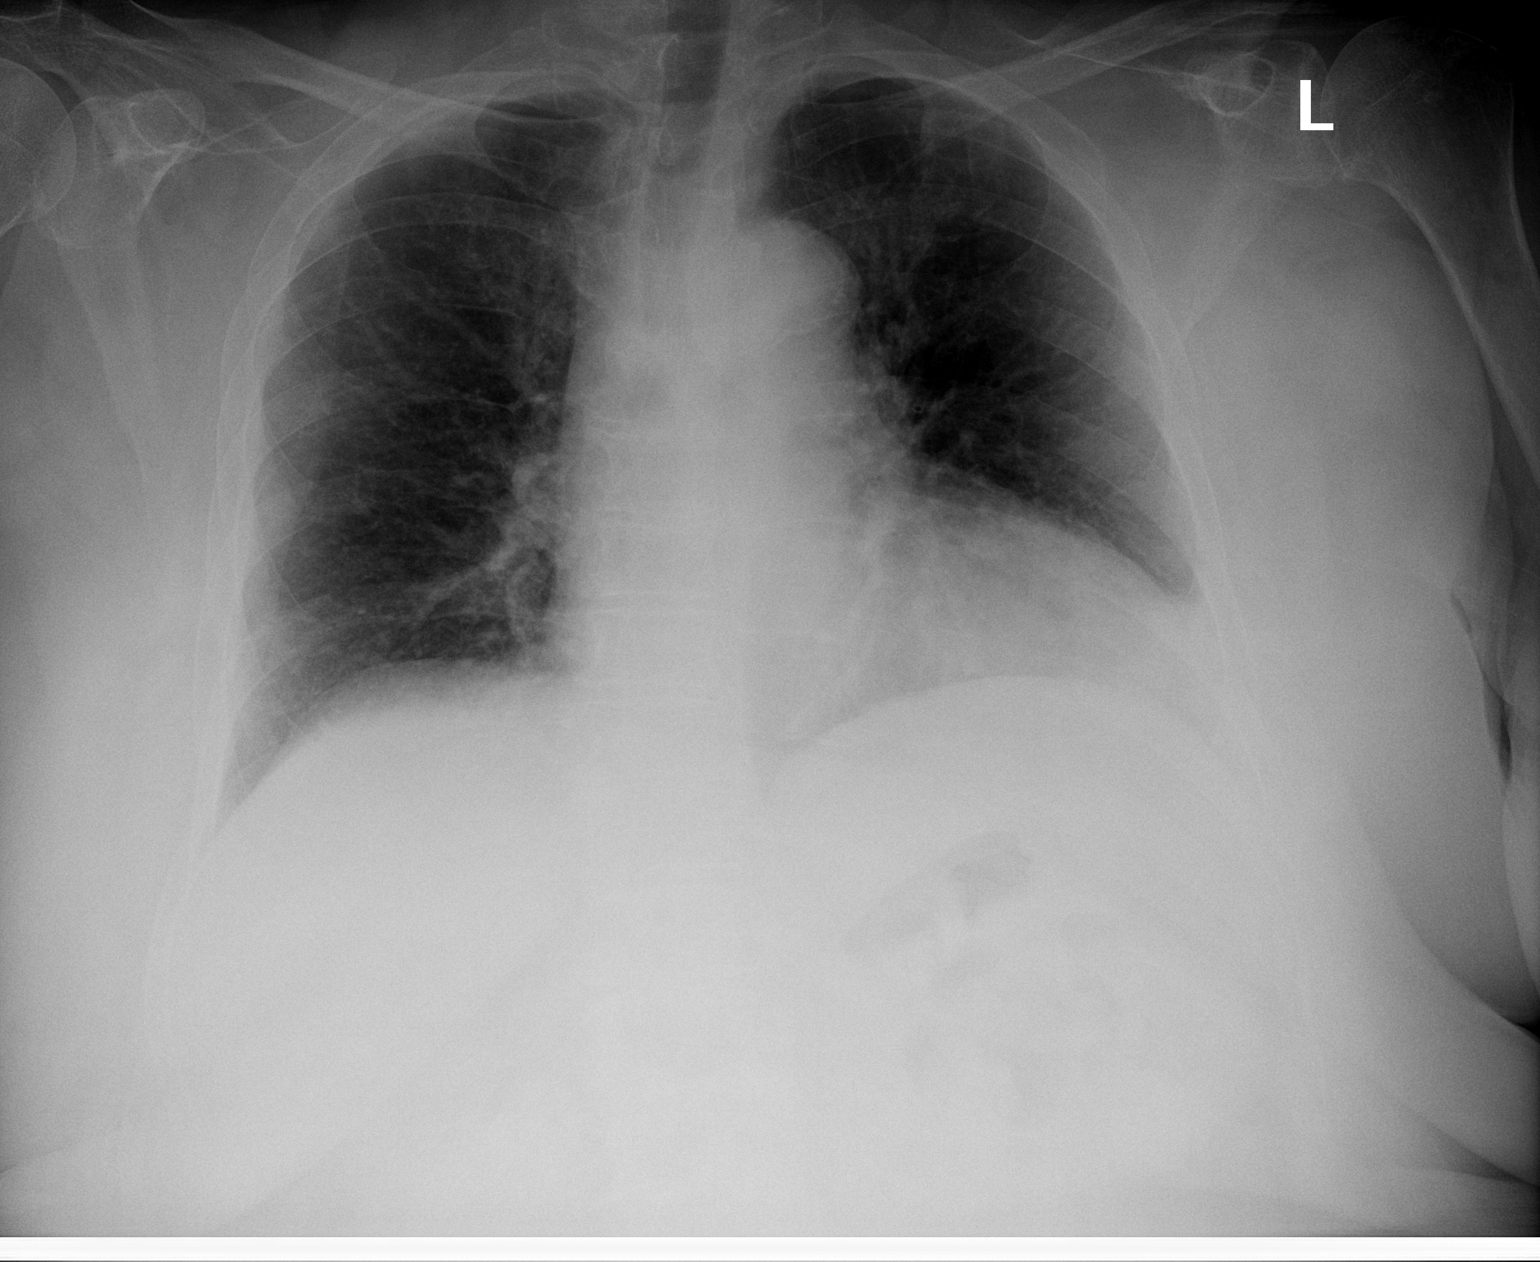

[2 of 2 positions shown; findings below may reference images not displayed]

FINDINGS: Cardiomediastinal contour is unremarkable. No aggressive bone lesions are seen. Lungs are symmetrically aerated and clear.
IMPRESSION: 
IMPRESSION: No acute findings

## 2021-04-15 IMAGING — US US GUIDED NEEDLE LIVER BIOPSY
1 series · 13 of 20 positions shown · non-contrast
Comparison: none

FINAL REPORT:
PROCEDURE: Ultrasound-guided liver biopsy
HISTORY: Abnormal liver function studies
FINDINGS/TECHNIQUE:
After written, informed consent was obtained, the right upper quadrant was examined with ultrasound. An appropriate site for puncture was marked on the skin, and this area was cleaned, prepped, and draped in typical sterile fashion. 1% lidocaine was administered for local anesthesia. Intravenous fentanyl and Versed were administered for moderate sedation, and continuous monitoring was performed by the IR nurse. Supervised continuous monitoring was performed for 20 minutes.
The liver parenchyma was subsequently biopsied with a 19/20-gauge coaxial biopsy system from an anterior lateral approach. A total of 6, 20-gauge core specimens were obtained. These are placed in formalin and submitted to pathology. Small amount of Gelfoam slurry was injected via the introducer needle as the needle removed to aid in hemostasis. Post biopsy ultrasound demonstrates no significant hematoma. Sterile dressing was applied. Patient tolerated the procedure well without immediate complication.

[Series 1: us guided needle liver biopsy · 13 of 20 slices shown]
[im 1/20]
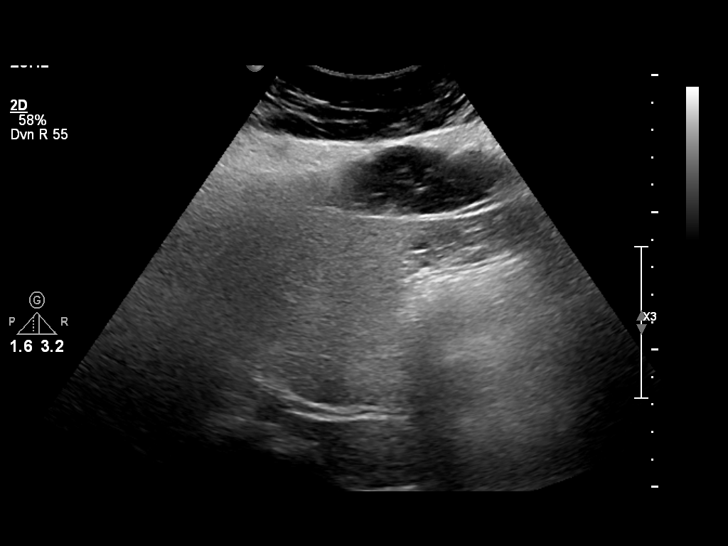
[im 3/20]
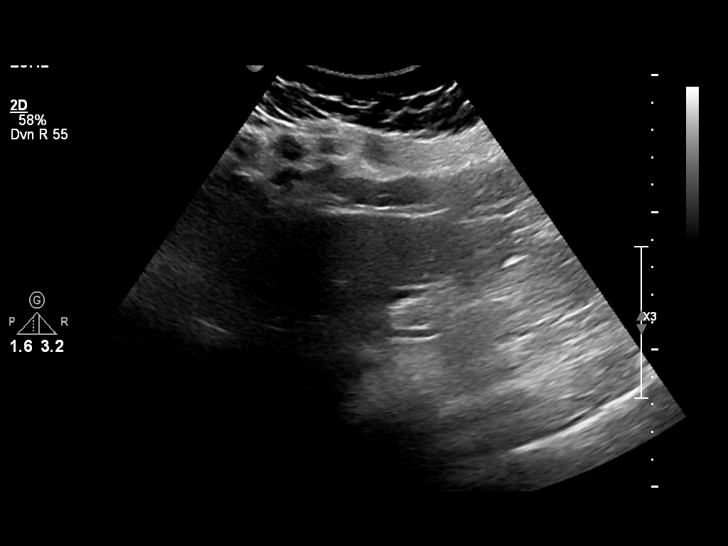
[im 4/20]
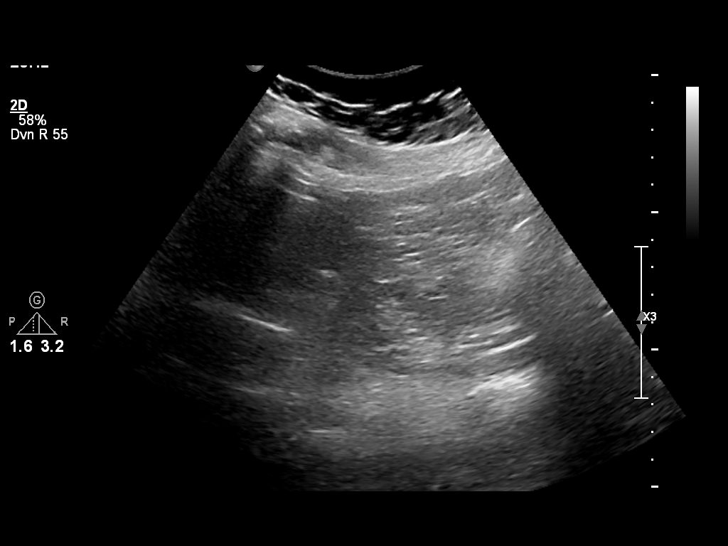
[im 6/20]
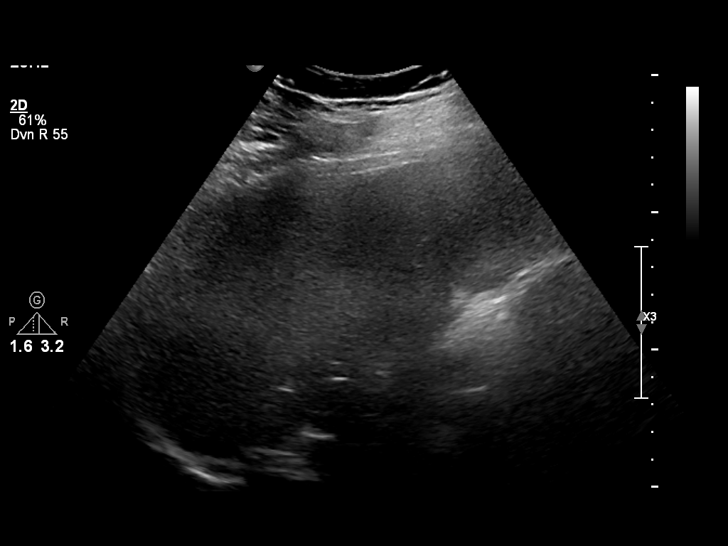
[im 7/20]
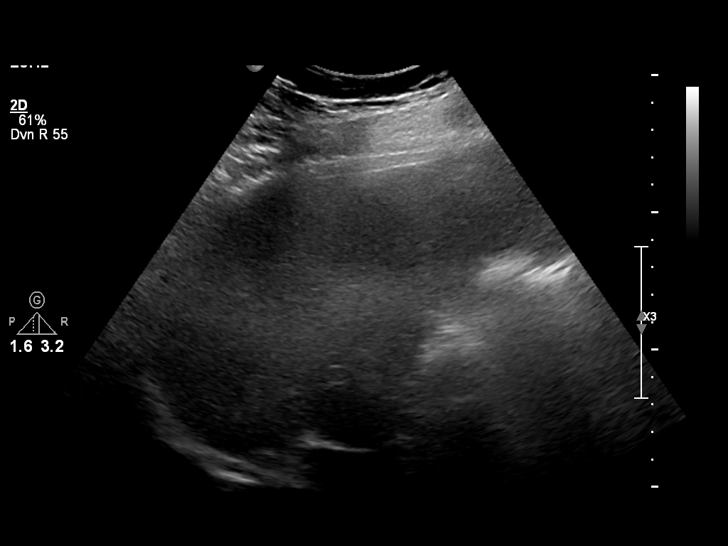
[im 9/20]
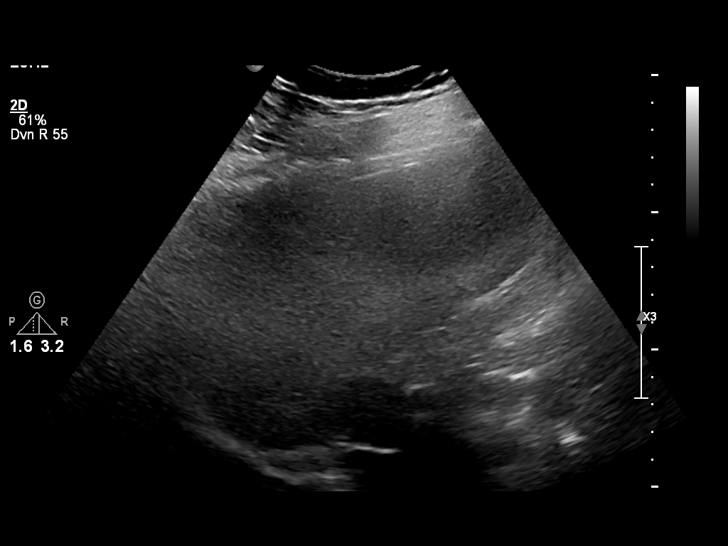
[im 11/20]
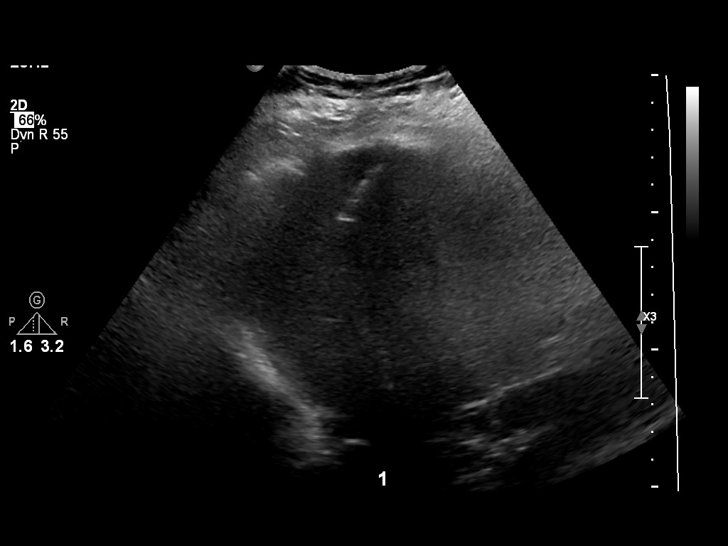
[im 12/20]
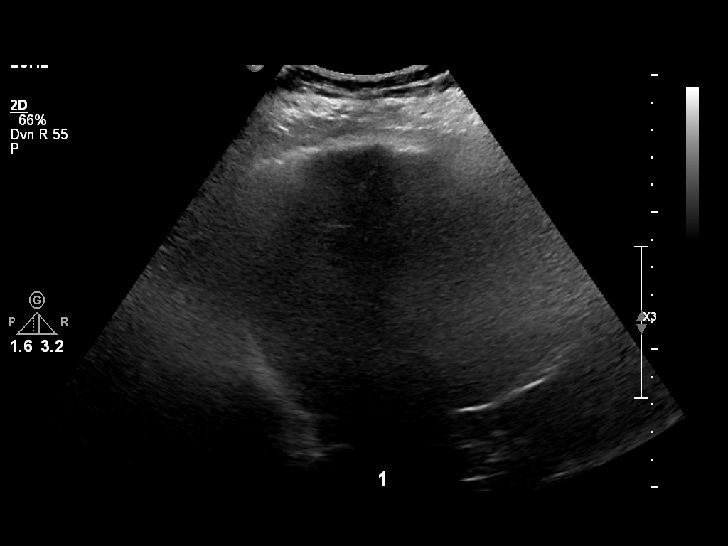
[im 14/20]
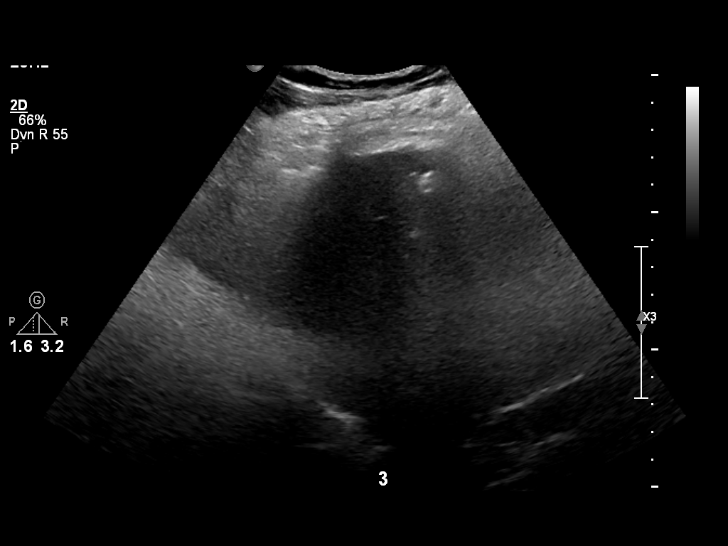
[im 15/20]
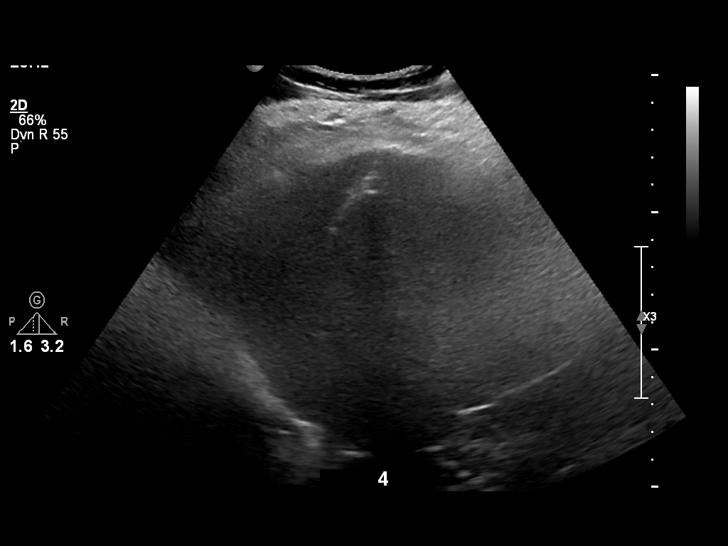
[im 17/20]
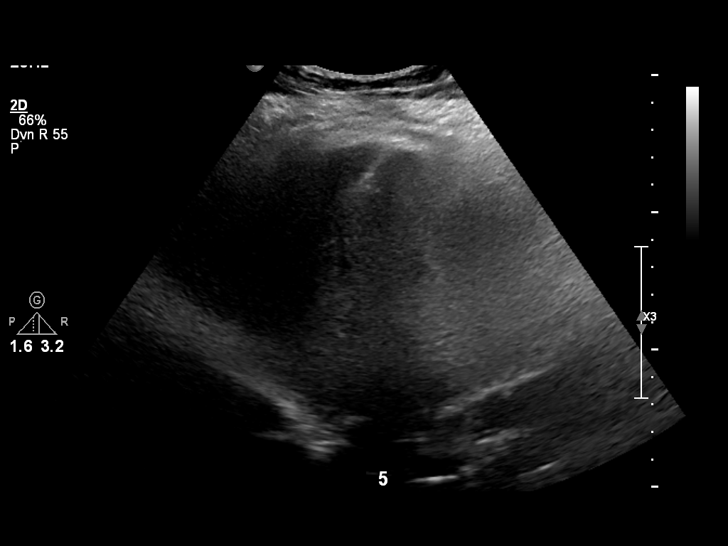
[im 18/20]
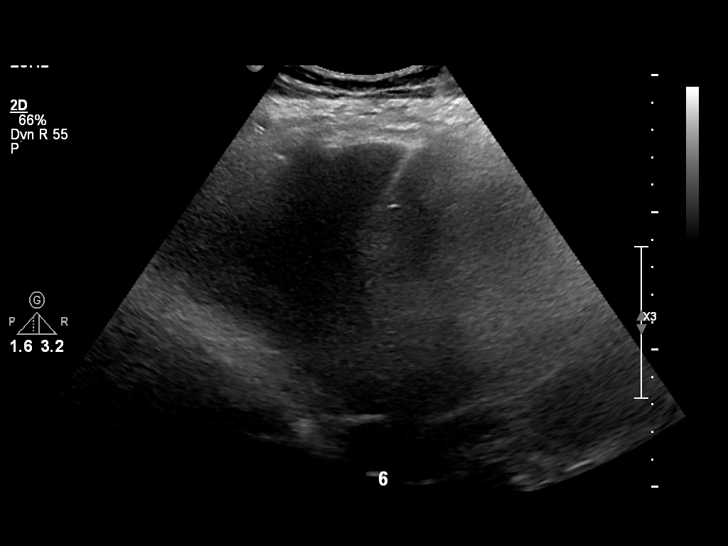
[im 20/20]
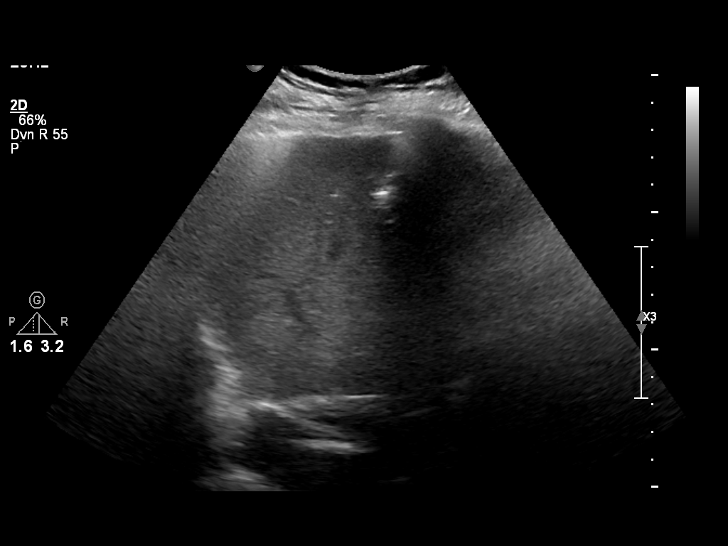

[13 of 20 positions shown; findings below may reference images not displayed]

IMPRESSION: Ultrasound-guided liver parenchymal core biopsy as above.

## 2021-05-04 IMAGING — CR XR HAND 3+ VIEWS BILATERAL
1 series · 6 of 6 positions shown · non-contrast
Comparison: none

Bilat hand pain
FINAL REPORT:
XR HAND 3+ VIEWS BILATERAL
INDICATION: Provided clinical information: "Pain" Additional information: Pain.
TECHNIQUE: Radiographs. Site / views listed above.
_______________

[Series 7086: PA · left · 6 of 6 slices shown]
[im 1/6]
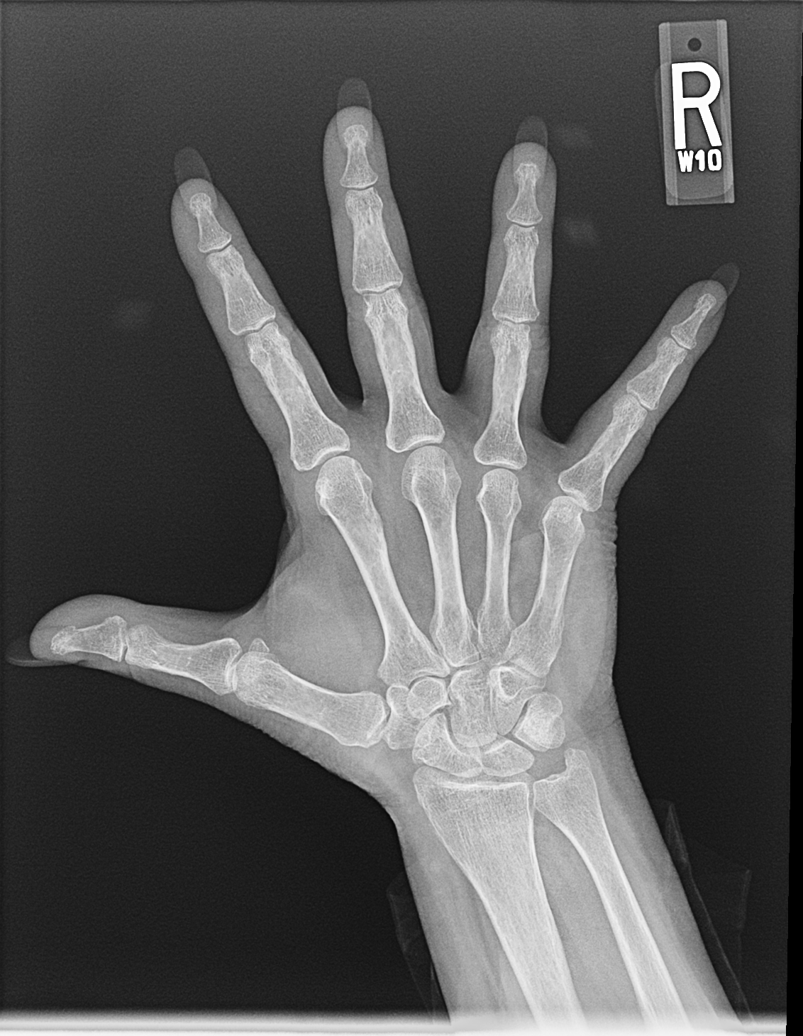
[im 2/6]
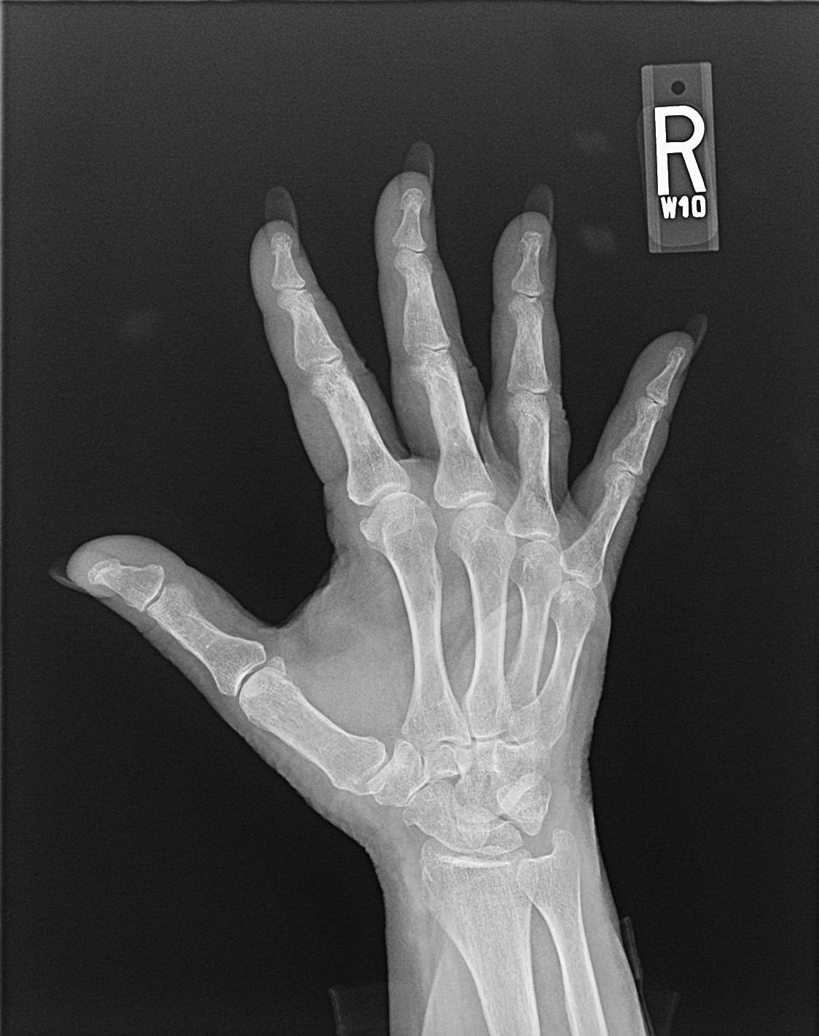
[im 3/6]
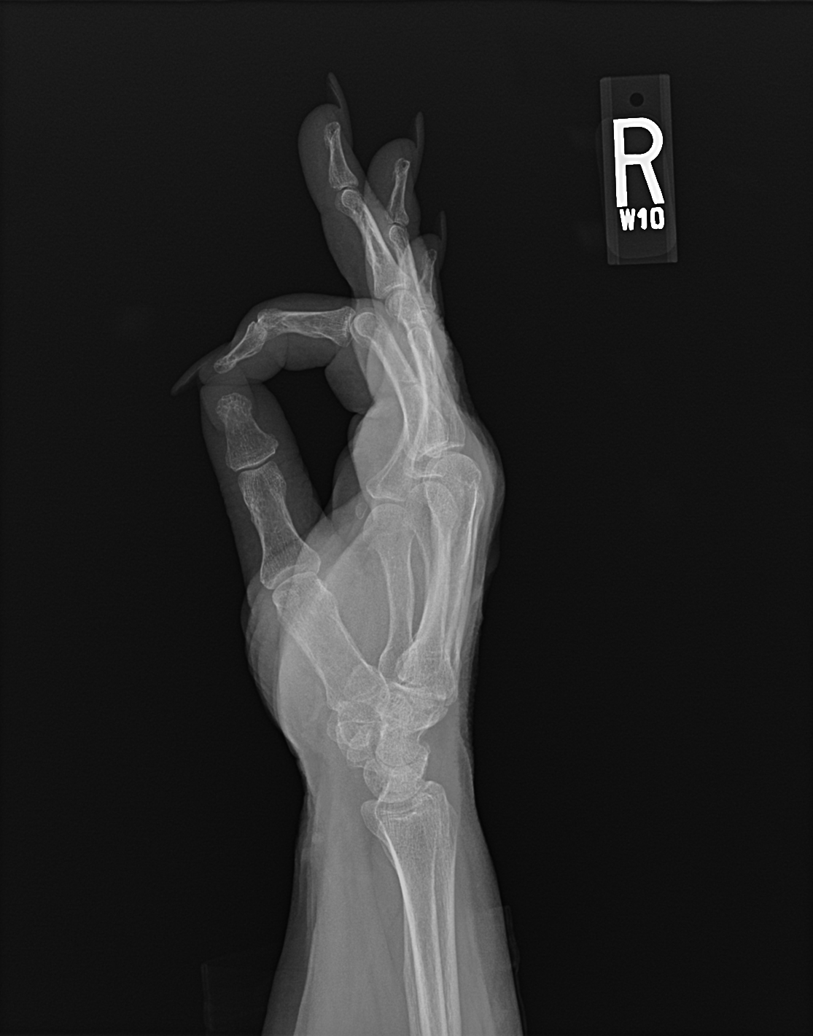
[im 4/6]
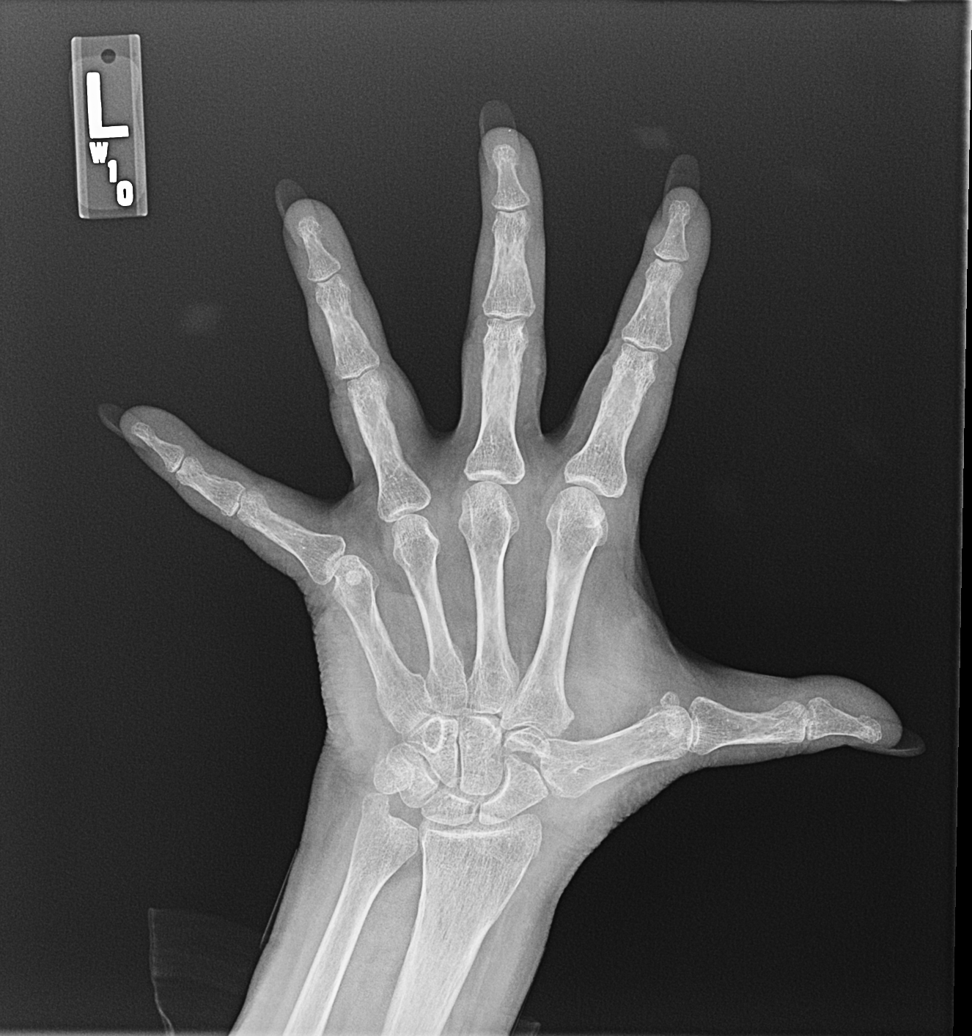
[im 5/6]
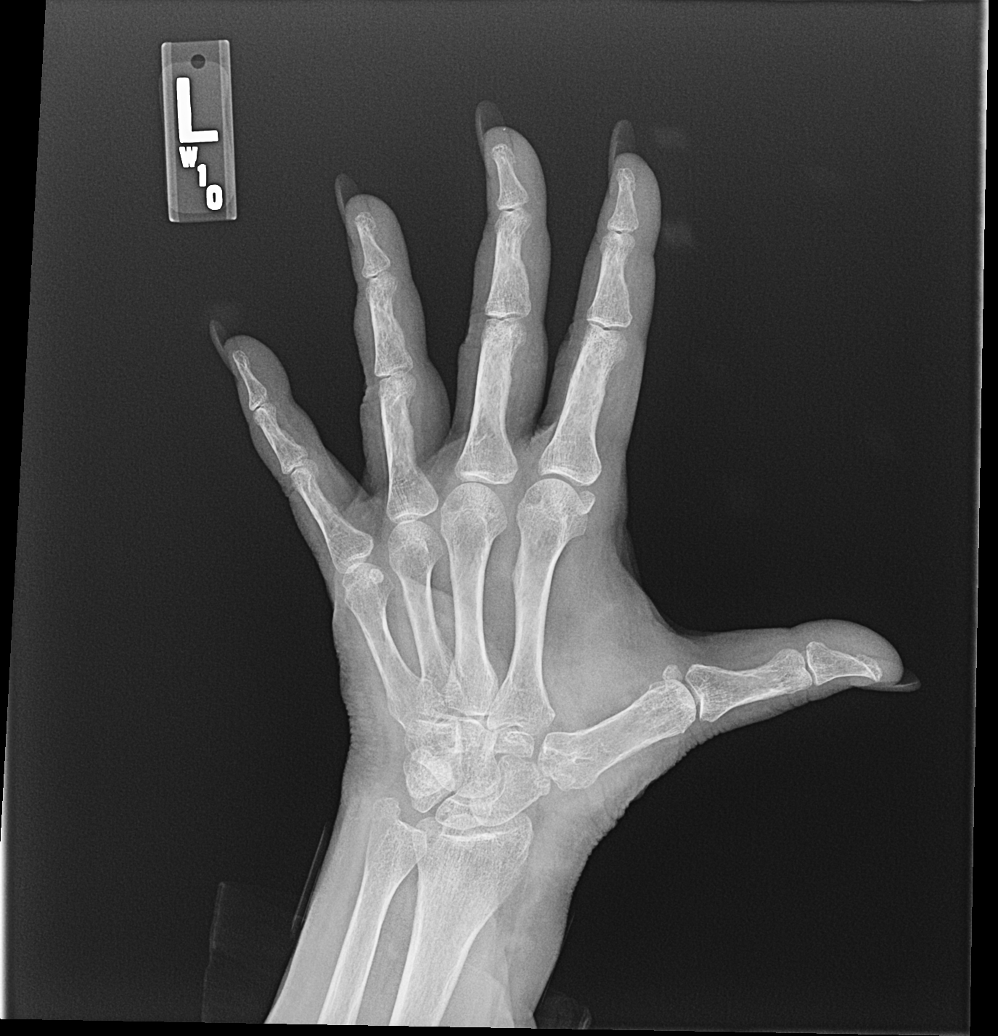
[im 6/6]
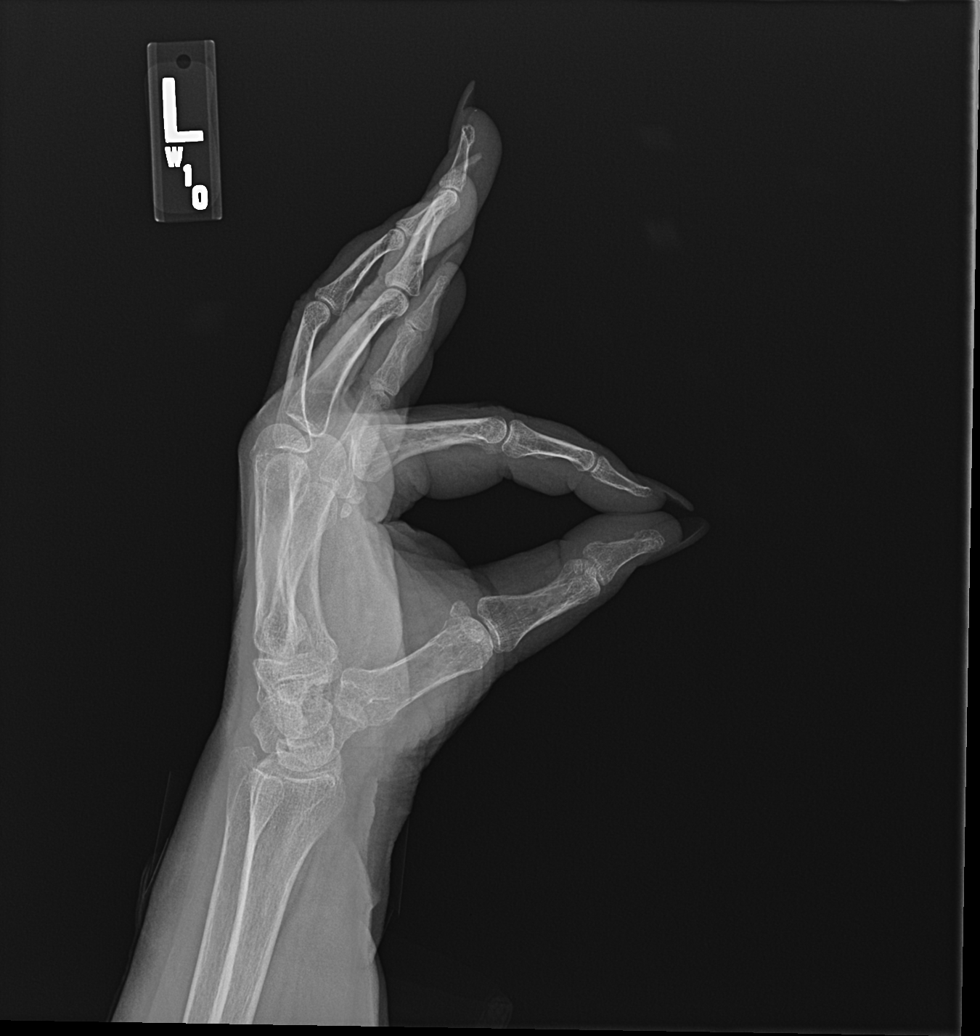

[6 of 6 positions shown; findings below may reference images not displayed]

FINDINGS: There is no acute fracture or dislocation.
No significant degenerative changes.
No radiopaque foreign body or subcutaneous emphysema in the soft tissue.
_______________
IMPRESSION: No acute osseous abnormality.

## 2021-05-04 IMAGING — CR XR SHOULDER 2+ VIEWS BILATERAL
1 series · 6 of 6 positions shown · non-contrast
Comparison: none

Bilat shoulder pain
FINAL REPORT:
XR SHOULDER 2+ VIEWS BILATERAL
INDICATION: Provided clinical information: "No" Additional information: Pain.
TECHNIQUE: Radiographs. Site / views listed above.
_______________

[Series 7092: AP · left · 6 of 6 slices shown]
[im 1/6]
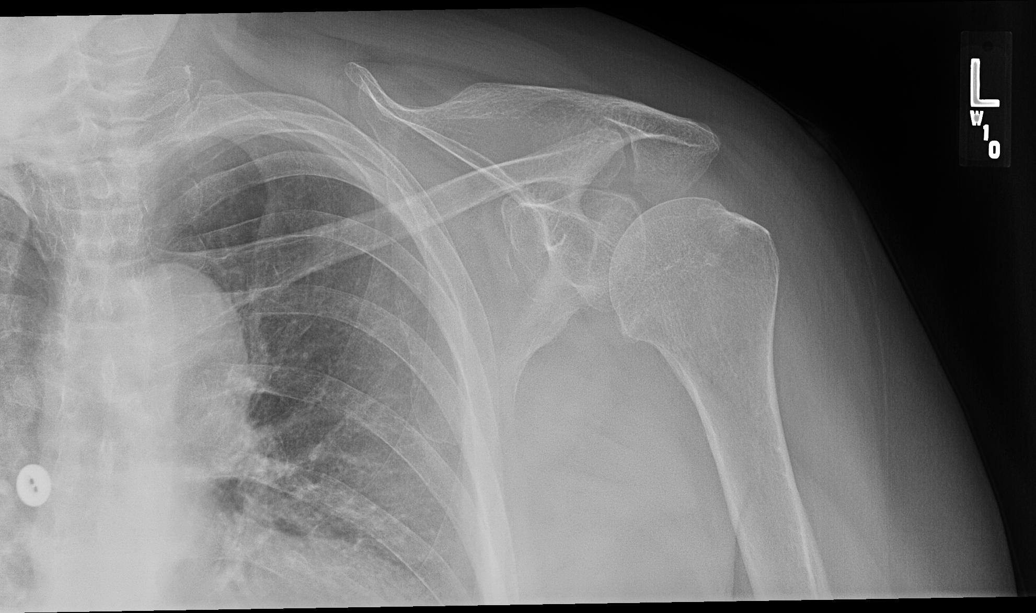
[im 2/6]
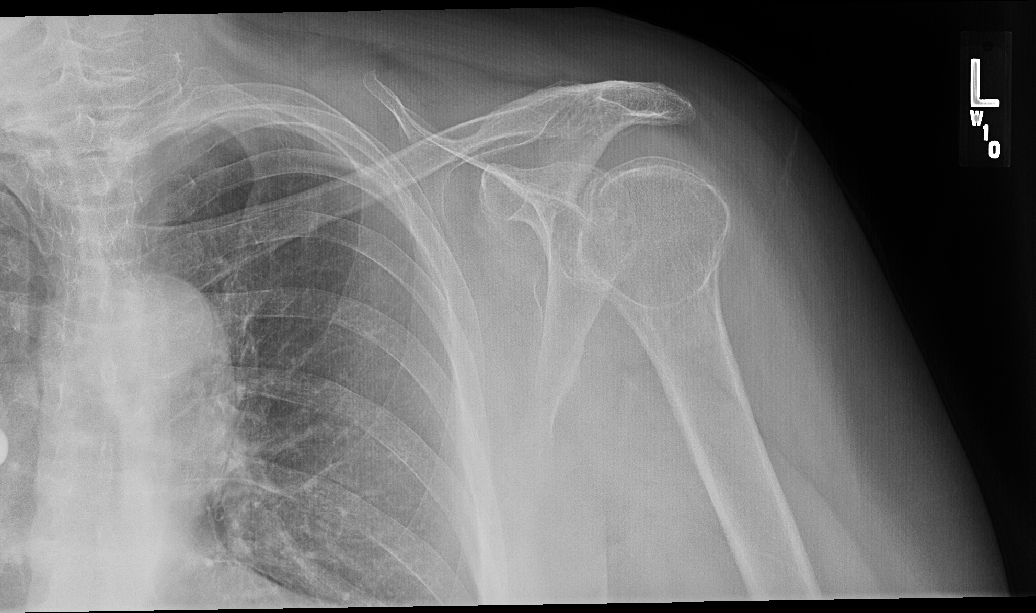
[im 3/6]
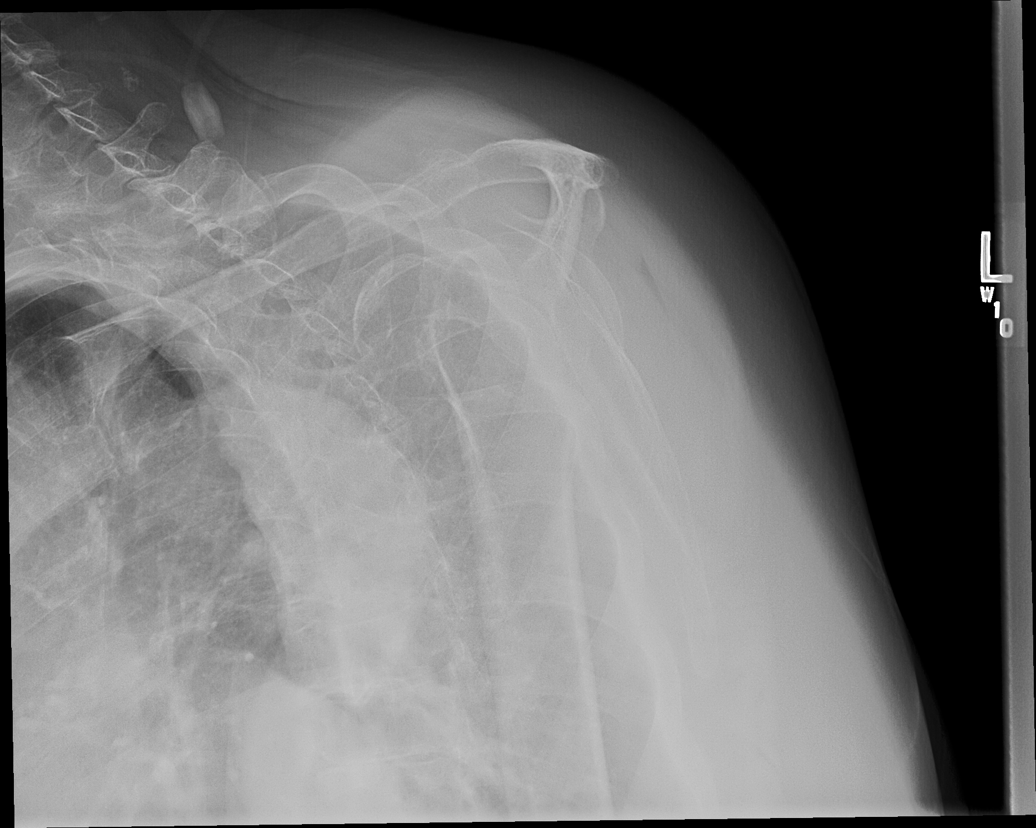
[im 4/6]
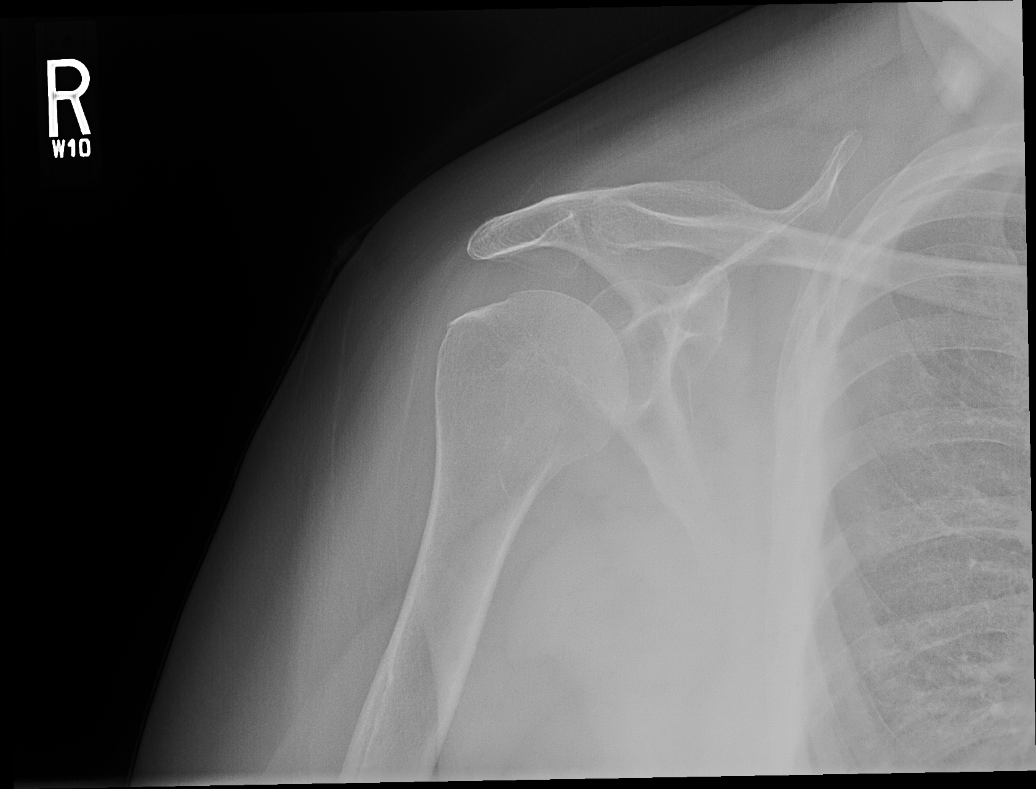
[im 5/6]
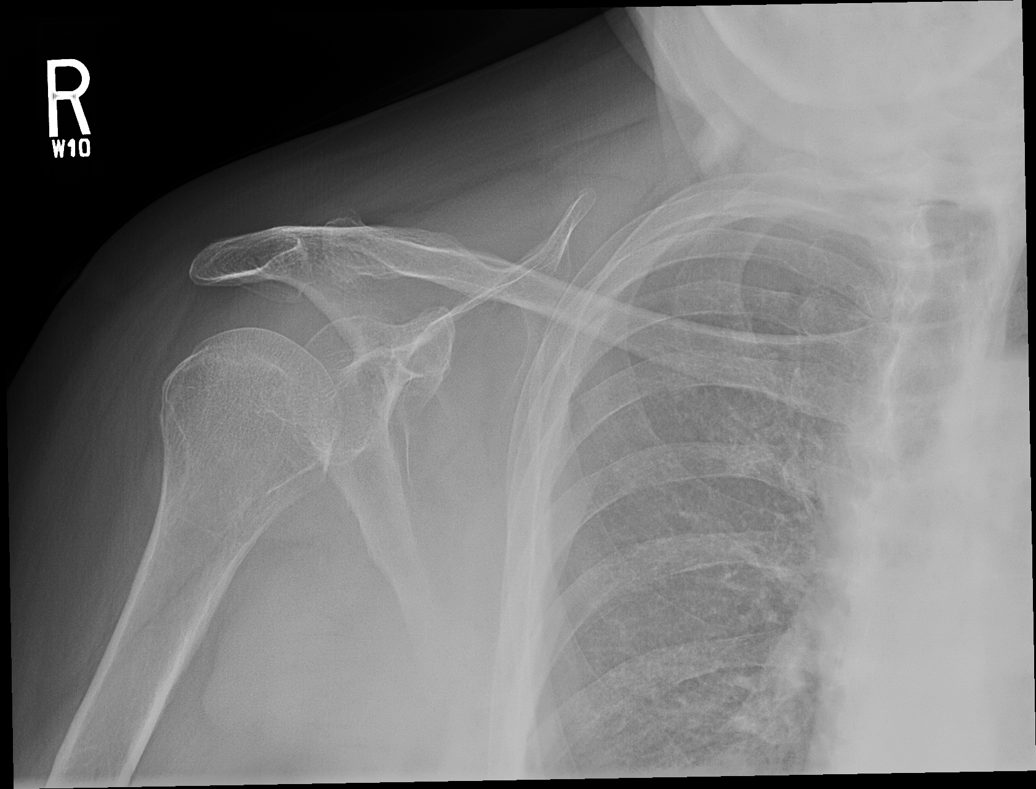
[im 6/6]
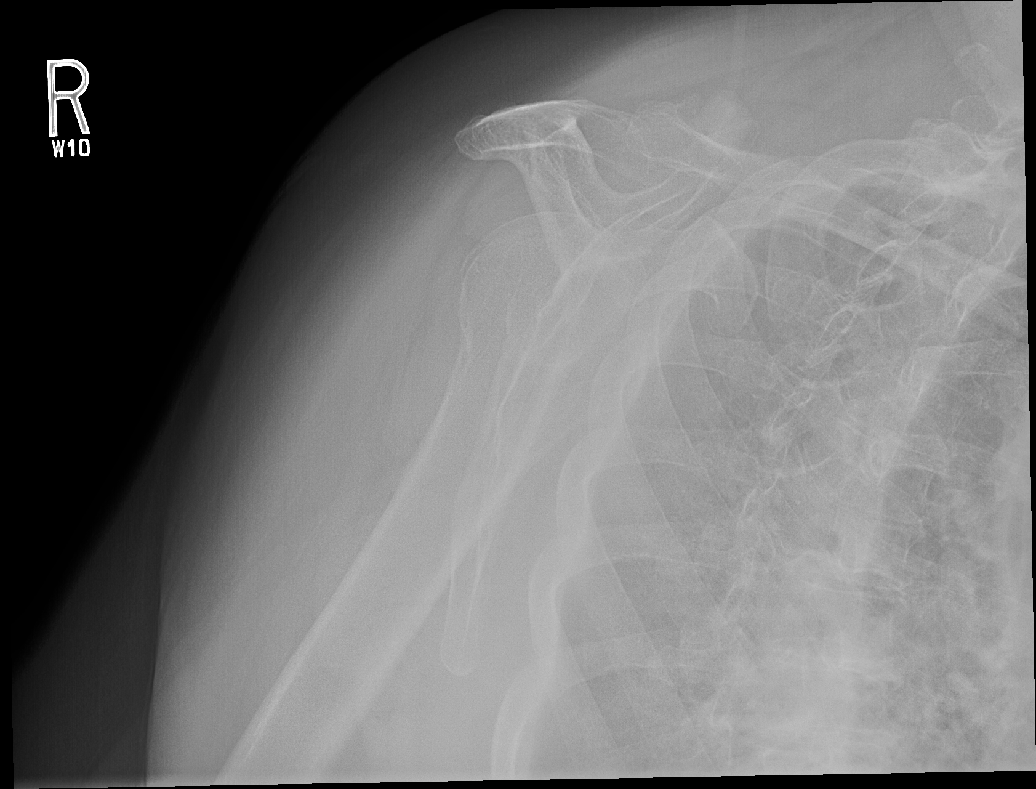

[6 of 6 positions shown; findings below may reference images not displayed]

FINDINGS: There is no acute fracture or dislocation.
Moderate acromioclavicular and glenohumeral degenerative changes.
No radiopaque foreign body or subcutaneous emphysema in the soft tissue.
_______________
IMPRESSION: No acute osseous abnormality.

## 2021-07-28 IMAGING — MR MRI SHOULDER LEFT WITHOUT IV CONTRAST
10 series · 40 of 40 positions shown · non-contrast
Comparison: Radiographs of the left shoulder 05/04/2021

Shoulder pain , lifting and pulling trauma, pt in severe pain best scan possible
FINAL REPORT:
MRI SHOULDER LEFT WITHOUT IV CONTRAST
INDICATION: Pain.
TECHNIQUE: Multiplanar multisequence MR imaging of the left shoulder was performed without contrast.

[Series 1001: survey · coronal · left · 1.6mm · 1.56mm/px · 16 of 120 slices shown]
[im 1/120]
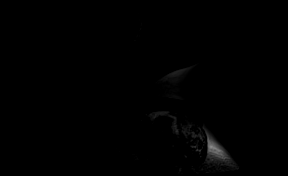
[im 8/120]
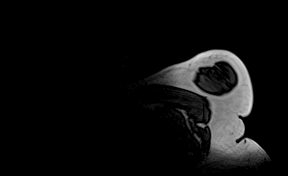
[im 16/120]
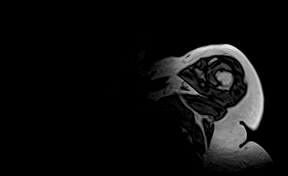
[im 24/120]
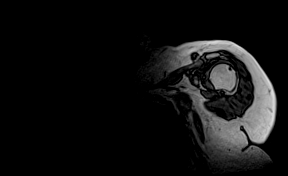
[im 32/120]
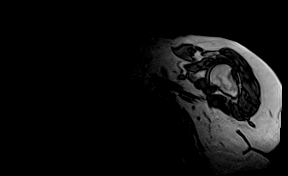
[im 40/120]
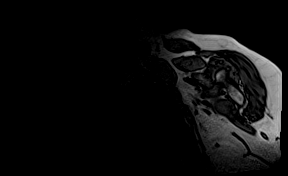
[im 48/120]
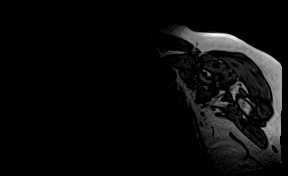
[im 56/120]
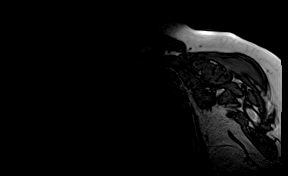
[im 64/120]
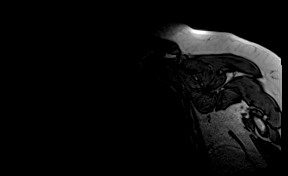
[im 72/120]
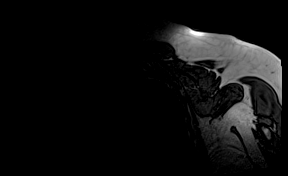
[im 80/120]
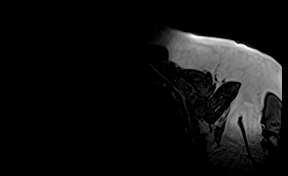
[im 88/120]
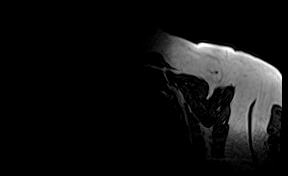
[im 96/120]
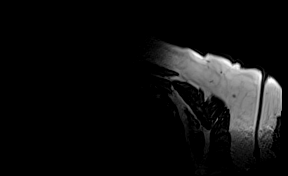
[im 104/120]
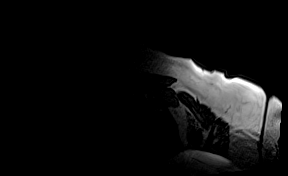
[im 112/120]
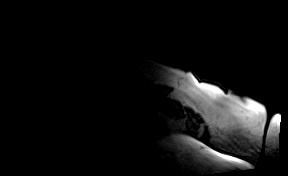
[im 120/120]
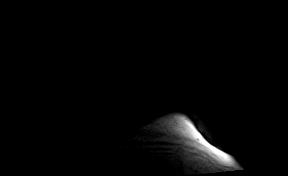

[Series 2001: survey_mpr_sag · sagittal · left · 1.6mm · 1.56mm/px · 1 of 9 slices shown]
[im 1/9]
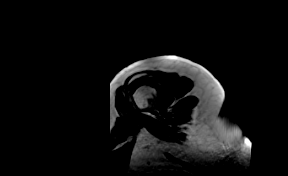

[Series 3001: survey_mpr_cor · coronal · left · 1.6mm · 1.56mm/px · 1 of 11 slices shown]
[im 1/11]
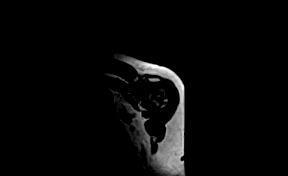

[Series 4001: survey_mpr_(person_name) · axial · left · 1.6mm · 1.56mm/px · z∈[+29,+71]mm · 2 of 15 slices shown]
[im 1/15]
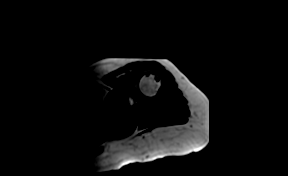
[im 15/15]
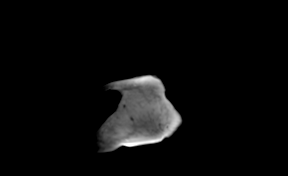

[Series 6001: PD fat-sat · axial · left · 3.0mm · 0.59mm/px · z∈[-38,+64]mm · 4 of 32 slices shown (1 of 2)]
[im 1/32]
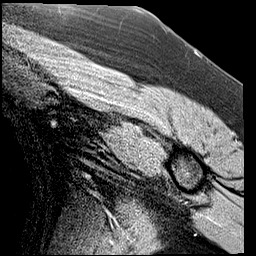
[im 11/32]
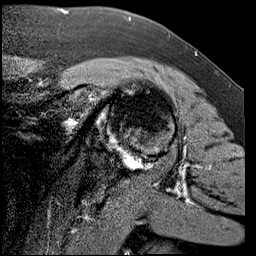
[im 21/32]
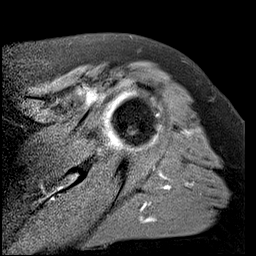
[im 32/32]
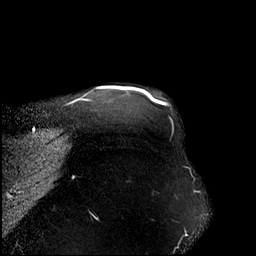

[Series 7001: PD · oblique · left · 3.0mm · 0.59mm/px · 3 of 25 slices shown]
[im 1/25]
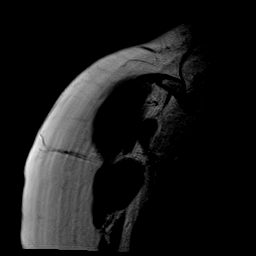
[im 13/25]
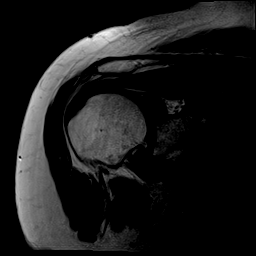
[im 25/25]
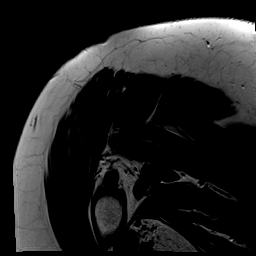

[Series 8001: T2 fat-sat · oblique · left · 3.0mm · 0.59mm/px · 3 of 25 slices shown (1 of 2)]
[im 1/25]
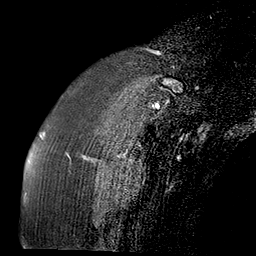
[im 13/25]
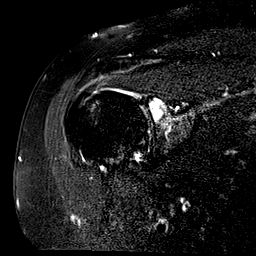
[im 25/25]
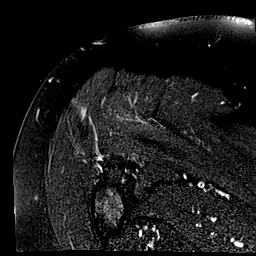

[Series 9001: T2 fat-sat · oblique · left · 3.0mm · 0.59mm/px · 3 of 27 slices shown (2 of 2)]
[im 1/27]
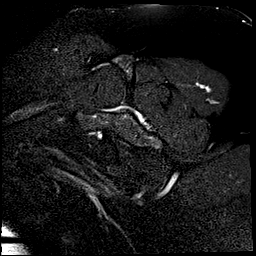
[im 14/27]
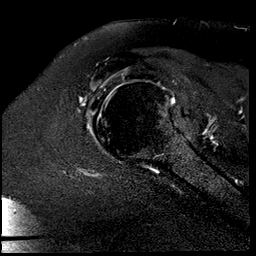
[im 27/27]
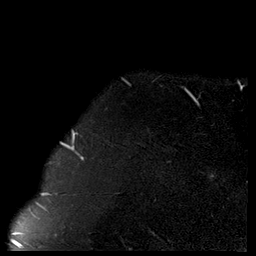

[T1 · oblique · left · 3.0mm · 0.70mm/px · 3 of 27 slices shown]
[im 1/27]
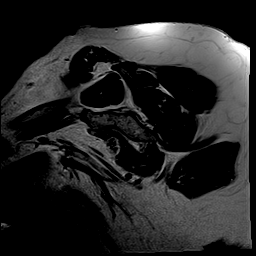
[im 14/27]
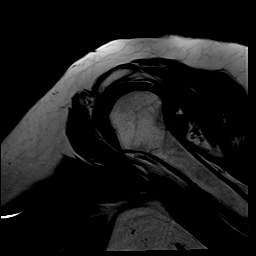
[im 27/27]
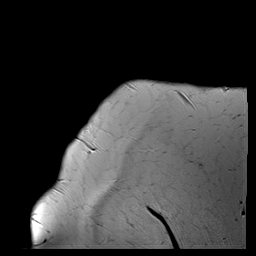

[PD fat-sat · axial · left · 3.0mm · 0.59mm/px · z∈[-38,+64]mm · 4 of 32 slices shown (2 of 2)]
[im 1/32]
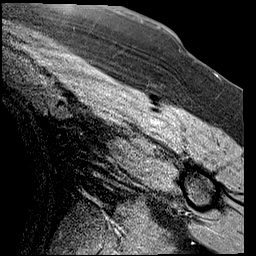
[im 11/32]
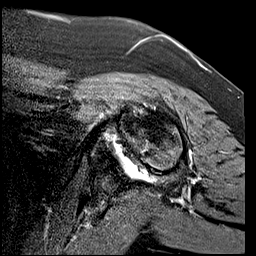
[im 21/32]
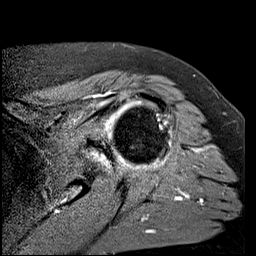
[im 32/32]
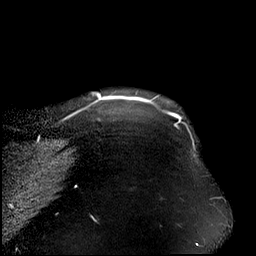

[40 of 40 positions shown; findings below may reference images not displayed]

FINDINGS: There is tendinosis of the supraspinatus tendon with low-grade intrasubstance tearing of the middle fibers of the footprint. There is tendinosis of the infraspinatus tendon without tear. The subscapularis and teres minor tendons are intact.
There is flattening of the long head of the biceps tendon intra-articularly and at the level of the pulley. The long head of the biceps tendon is suboptimally visualized in the groove which may be due to motion artifact. There is blunting of the labrum superiorly. Multifocal high-grade chondral loss in the glenoid with subchondral cystic change. High-grade cartilage loss in the superior aspect of the humeral head. Small marginal osteophytes.
There is mild osteoarthrosis of the acromioclavicular joint. There is a type II acromion undersurface.
There is a small glenohumeral effusion with subcentimeter bodies in the inferior aspect of the joint (image 4 series [id number], image 19 series [id number]). Minimal fluid in the subacromial subdeltoid bursa. No significant muscle/15.
IMPRESSION: 
IMPRESSION: 1. Rotator cuff tendinosis with low-grade intrasubstance tearing of the supraspinatus tendon at the footprint.
2. Glenohumeral osteoarthrosis with high-grade cartilage loss in the glenoid and superior aspect of the humeral head.
3. Degenerative labral tearing superiorly.
4. Small glenohumeral effusion with subcentimeter bodies in the inferior aspect of the joint.

## 2021-09-05 IMAGING — US US CAROTID DOPPLER BILATERAL
1 series · 14 of 24 positions shown · non-contrast
Comparison: None available.

FINAL REPORT:
EXAM: US CAROTID DOPPLER BILATERAL
INDICATION: tia
TECHNIQUE: Multiplanar duplex ultrasound images of the neck arteries were obtained bilaterally.

[Series 1: us carotid doppler bilateral · 14 of 55 slices shown]
[im 1/55]
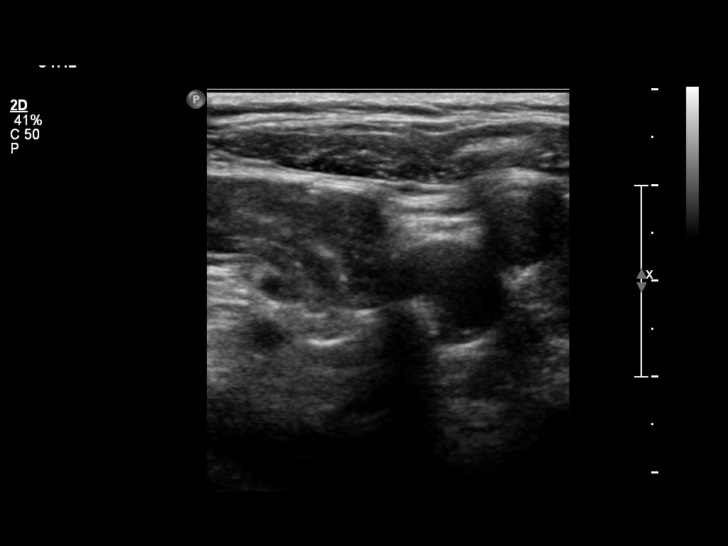
[im 5/55]
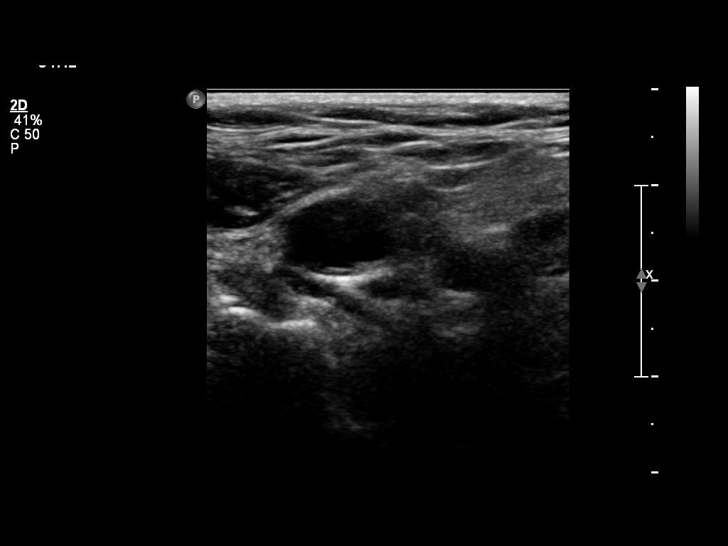
[im 10/55]
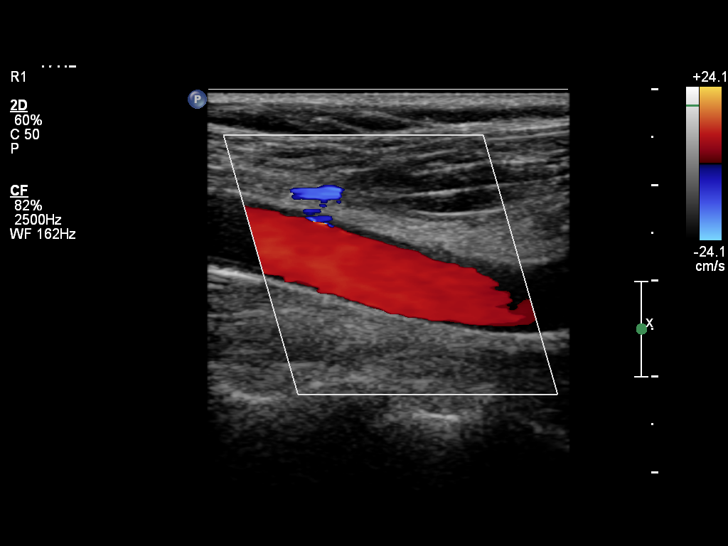
[im 15/55]
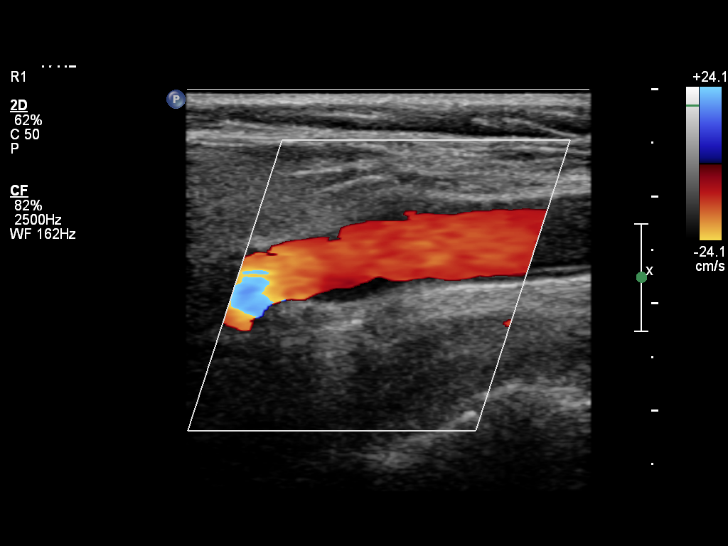
[im 17/55]
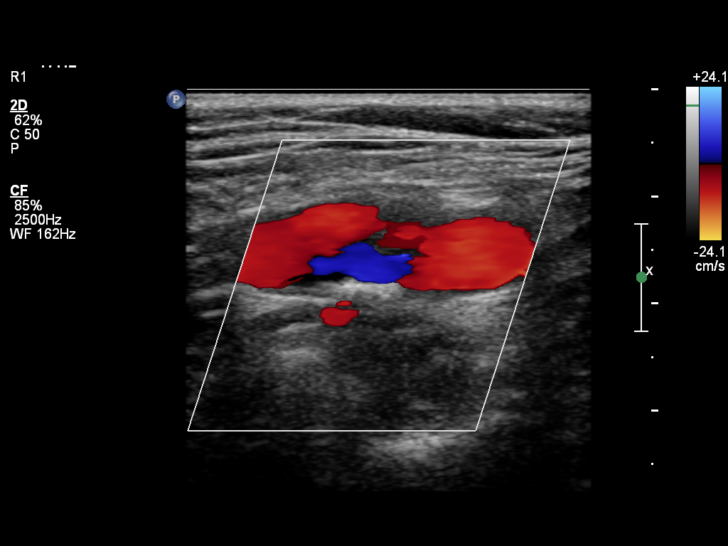
[im 22/55]
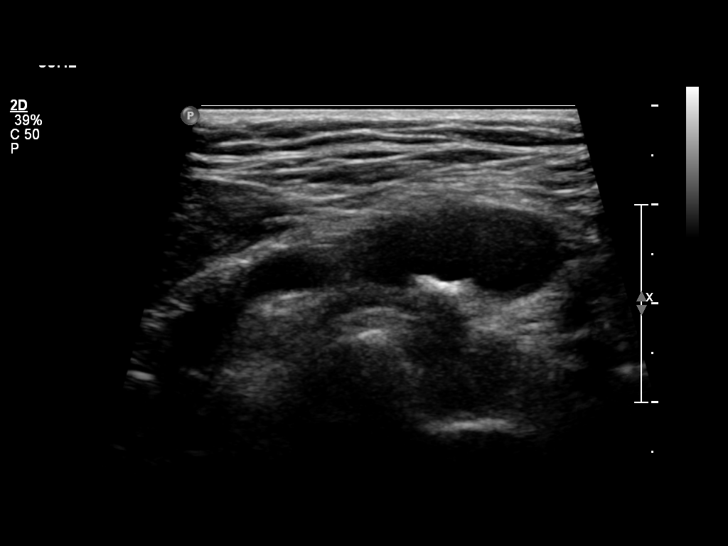
[im 26/55]
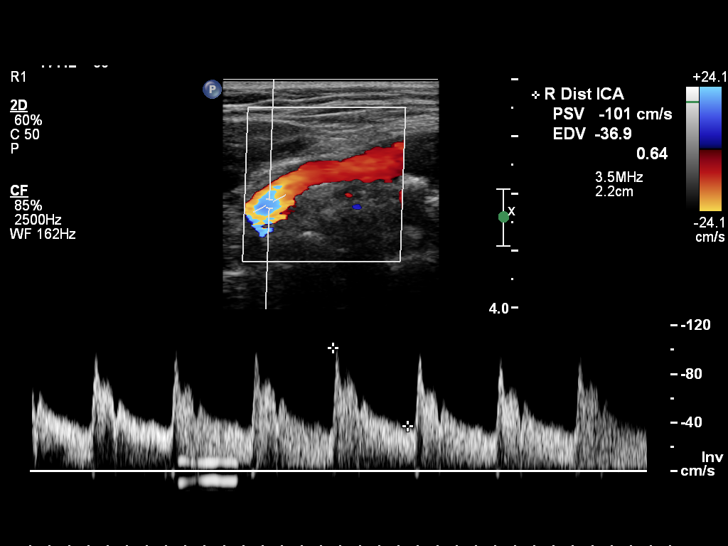
[im 29/55]
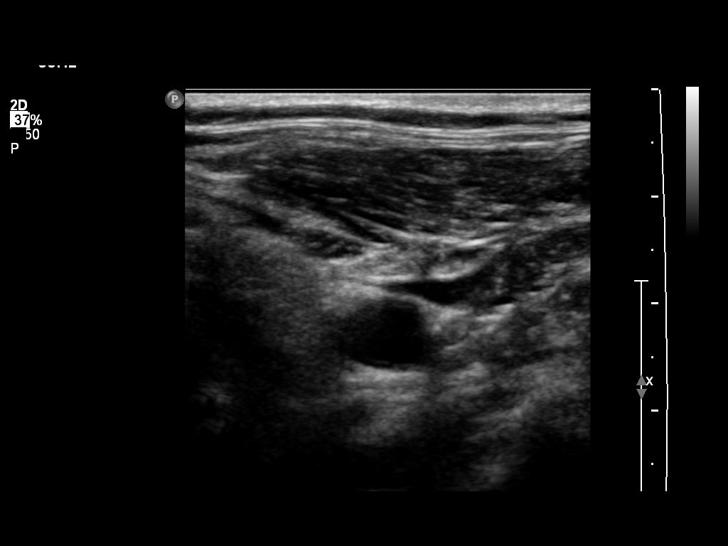
[im 33/55]
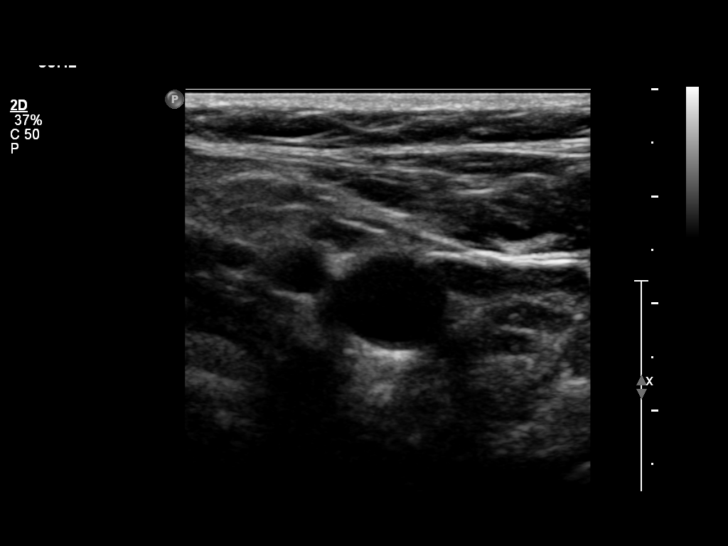
[im 38/55]
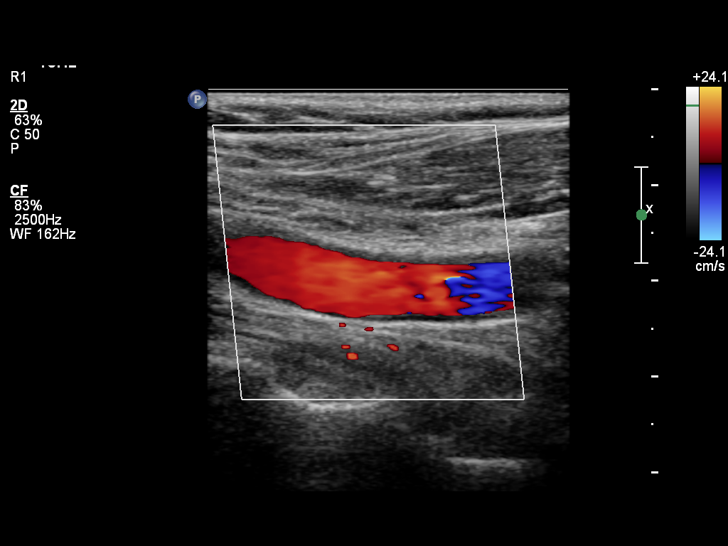
[im 43/55]
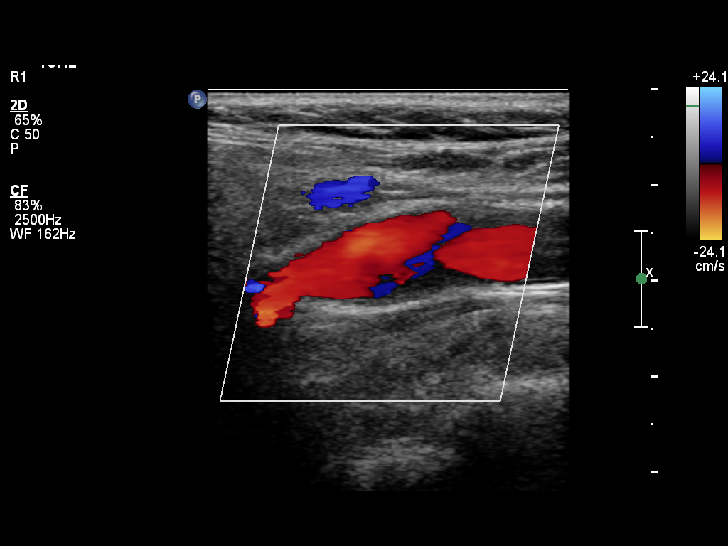
[im 45/55]
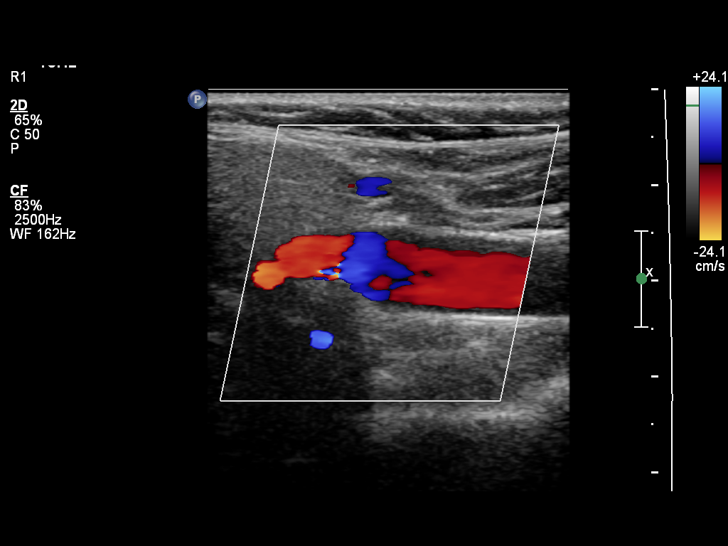
[im 50/55]
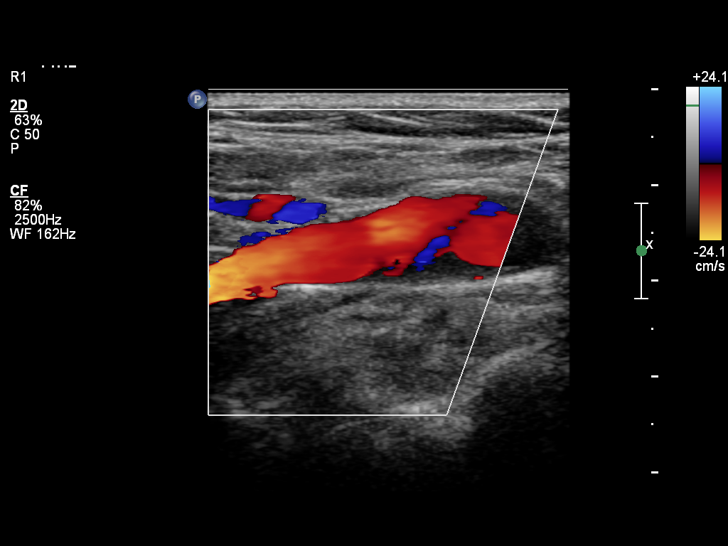
[im 55/55]
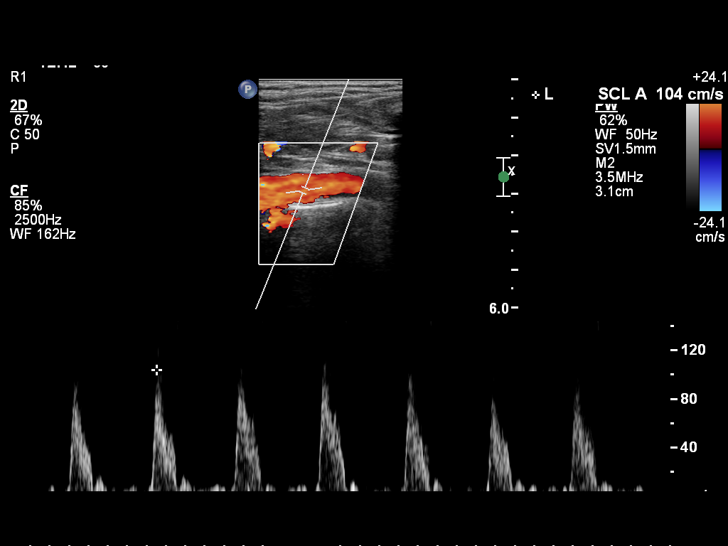

[14 of 24 positions shown; findings below may reference images not displayed]

FINDINGS: The right ICA peak systolic velocity is 101 cm/s with an ICA/CCA ratio 1.2. The right vertebral artery flow is antegrade. The right external carotid artery is patent.
The left ICA peak systolic velocity is 97 cm/s with an ICA/CCA ratio of 1.0. The left vertebral artery flow is antegrade. The left external carotid artery is patent.
IMPRESSION: No hemodynamically significant stenosis.
Stenoses based upon velocity measurement correlations as defined by the Society of Radiologists in Ultrasound Consensus Conference Radiology 7117; 229; 340-346. (#[id number]#)

## 2021-09-05 IMAGING — CT CT STROKE PROTOCOL
2 of 3 series · 15 of 40 positions shown, 18 images · non-contrast
Comparison: None available.

left side weakness and trouble forming words
History of brain surgery
Addendum:
(#[id number]
\F\[HOSPITAL]\F\
Communicated to: Dr.Joepet
On behalf of: Dr. Amnon Tiger
By: Polin Billiot
At: [DATE]
On: 09/05/2021
\F\/[HOSPITAL]\F\#)
FINAL REPORT:
CT STROKE PROTOCOL
INDICATION: Left-sided weakness. Trouble forming words. Stroke Alert.
TECHNIQUE: Noncontrast axial CT imaging obtained from skull base to vertex. All CT scans at this facility use iterative reconstruction technique, dose modulation and/or weight based dosing when appropriate to reduce radiation dose to as low as reasonably achievable.

[Series 2: head stnd · axial · 0.49mm/px · z∈[-1,+156]mm · 12 of 36 slices shown, 15 images]
[im 3/36  brain]
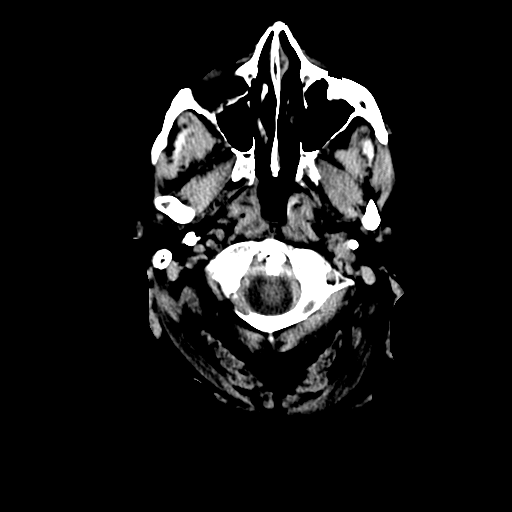
[im 3/36  bone]
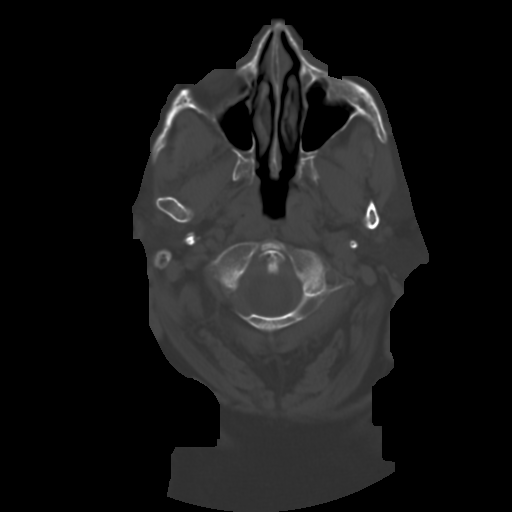
[im 5/36  brain]
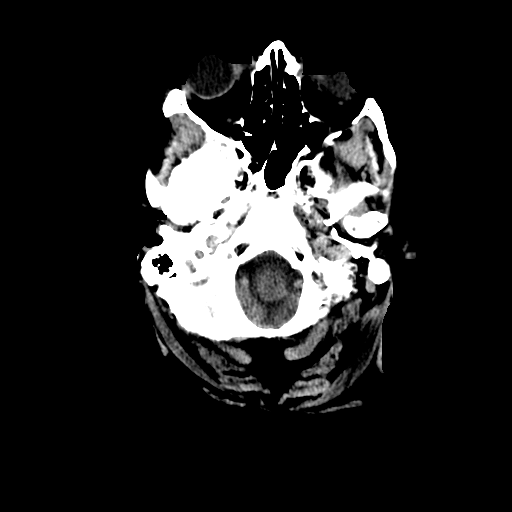
[im 8/36  brain]
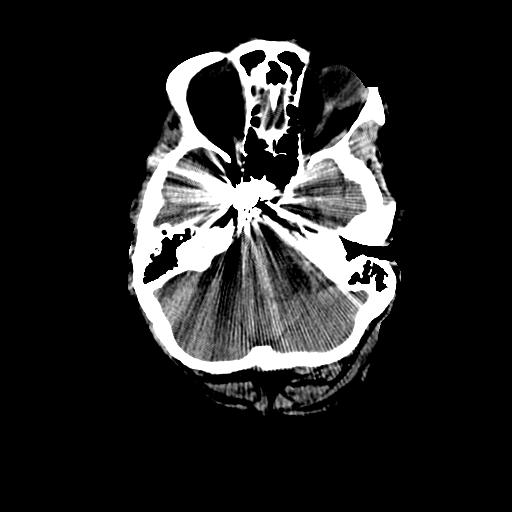
[im 11/36  brain]
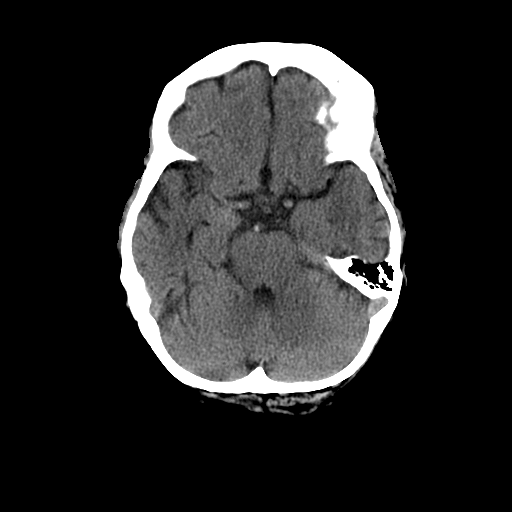
[im 14/36  brain]
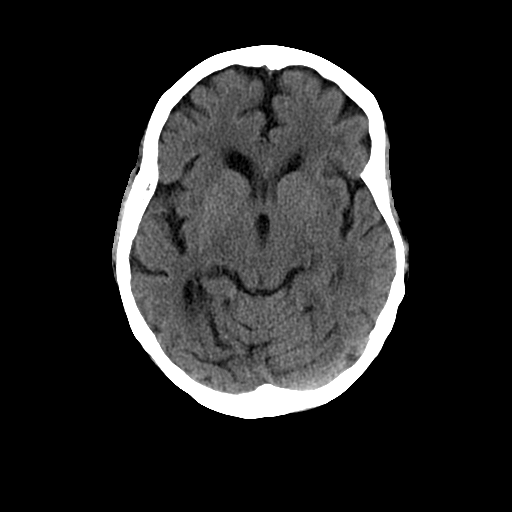
[im 14/36  bone]
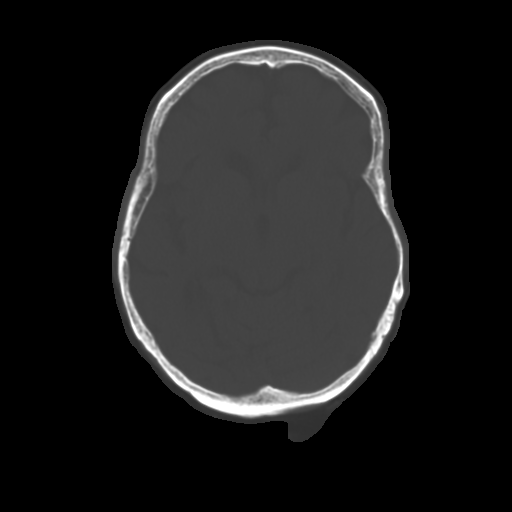
[im 16/36  brain]
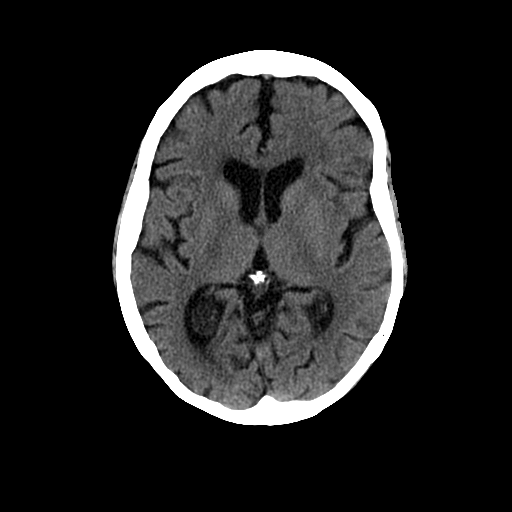
[im 20/36  brain]
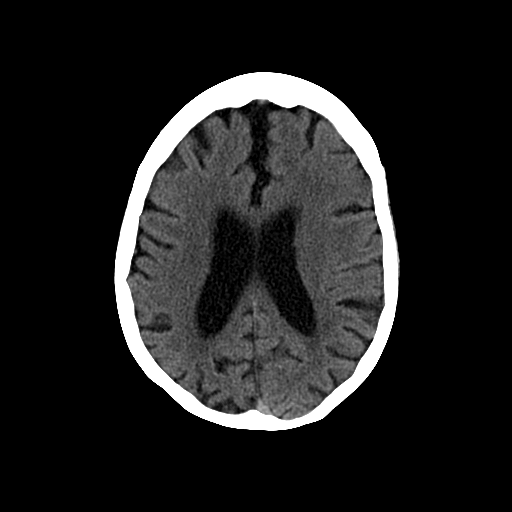
[im 22/36  brain]
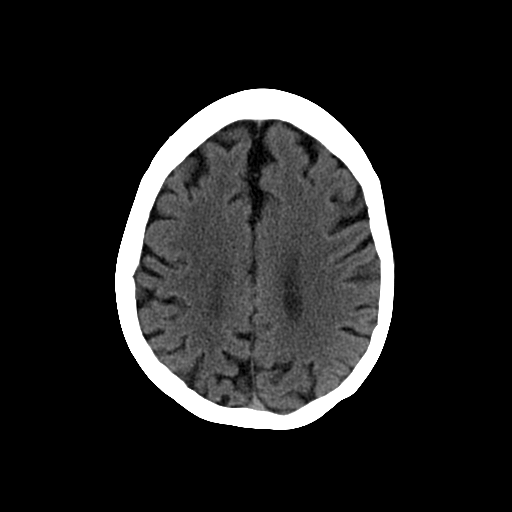
[im 25/36  brain]
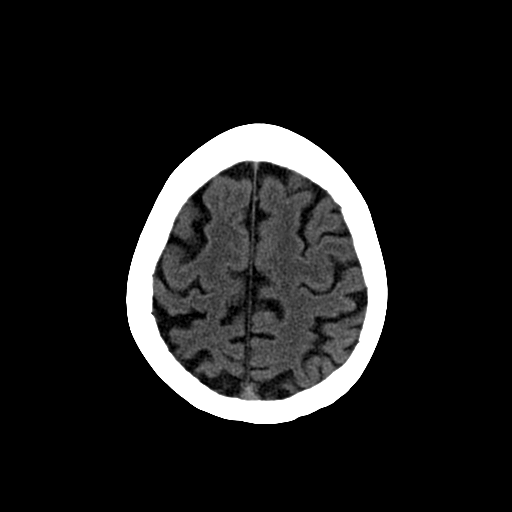
[im 25/36  bone]
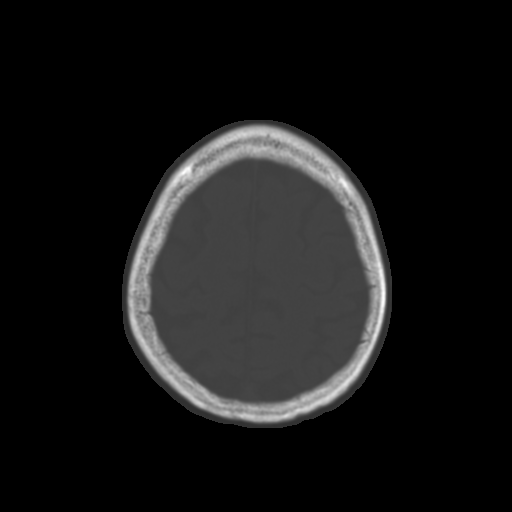
[im 28/36  brain]
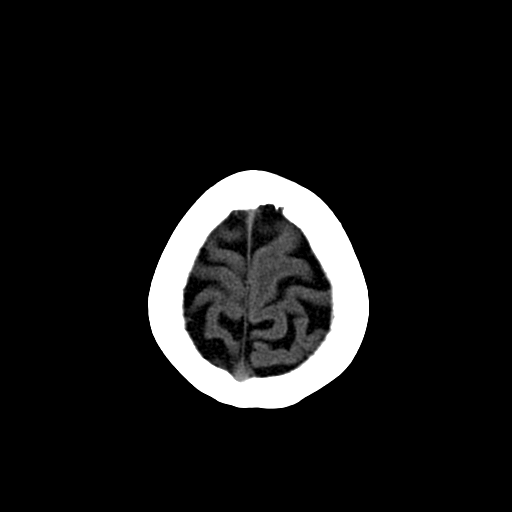
[im 31/36  brain]
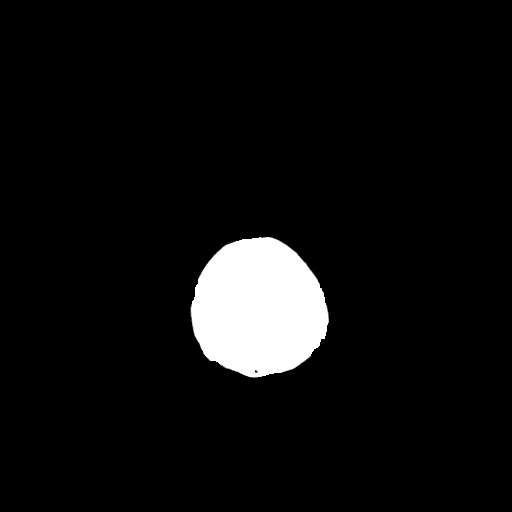
[im 33/36  brain]
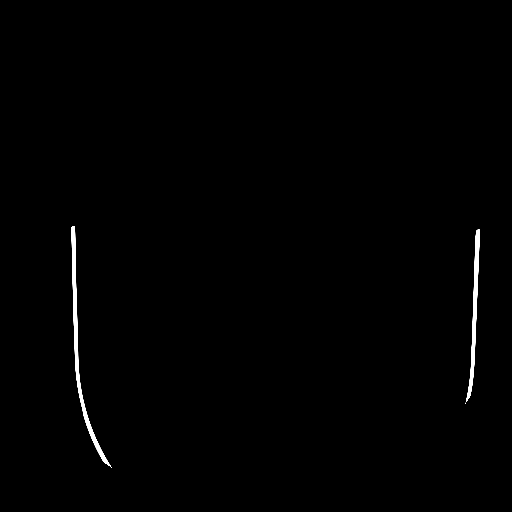

[Series 601: cor head · coronal · 0.49mm/px · 3 of 126 slices shown]
[im 51/126  brain]
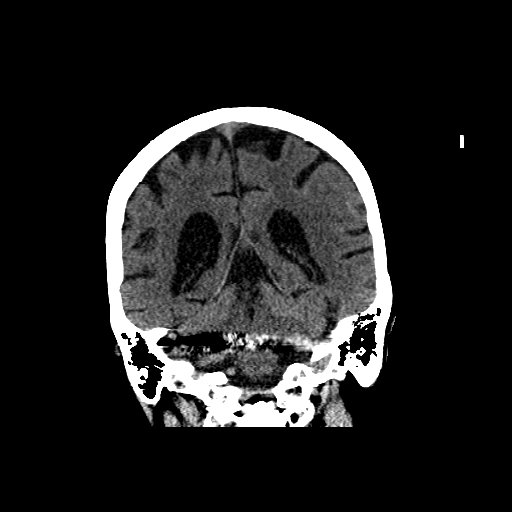
[im 64/126  brain]
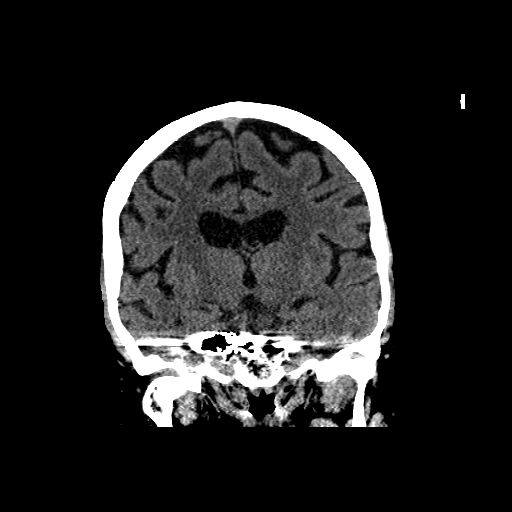
[im 76/126  brain]
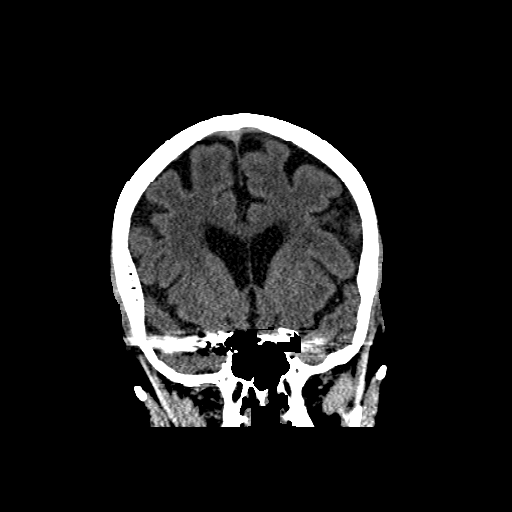

[15 of 40 positions shown; findings below may reference images not displayed]

FINDINGS: Brain parenchyma: No acute infarct, hemorrhage, mass or mass effect. There is a mild-to-moderate amount of periventricular and subcortical white matter lucencies suggestive of chronic hypertensive or microvascular ischemic changes.
Cervical cord: Visualized portions are normal.
CSF spaces: There is enlargement of the sulci with concordant enlargement of ventricles compatible with generalized atrophy. No extra-axial fluid collection or mass.
Soft tissues: Normal.
Bones: Calvarium and skull base appear normal.
Sinuses/mastoids: No significant sinus disease. Mastoids are clear.
Orbits: Bilateral lens extractions.
Vessels: No hyperdense vessel sign. Aneurysm coiling in the region of the right ICA obscuring surrounding tissue.
IMPRESSION: :
No acute intracranial abnormality.

## 2021-09-05 IMAGING — CR XR CHEST 1 VIEW
1 series · 2 of 2 positions shown · non-contrast
Comparison: 04/15/2021

FINAL REPORT:
XR CHEST 1 VIEW
INDICATION: dizziness

[Series 260: AP · 0.10mm/px · 2 of 2 slices shown]
[im 1/2]
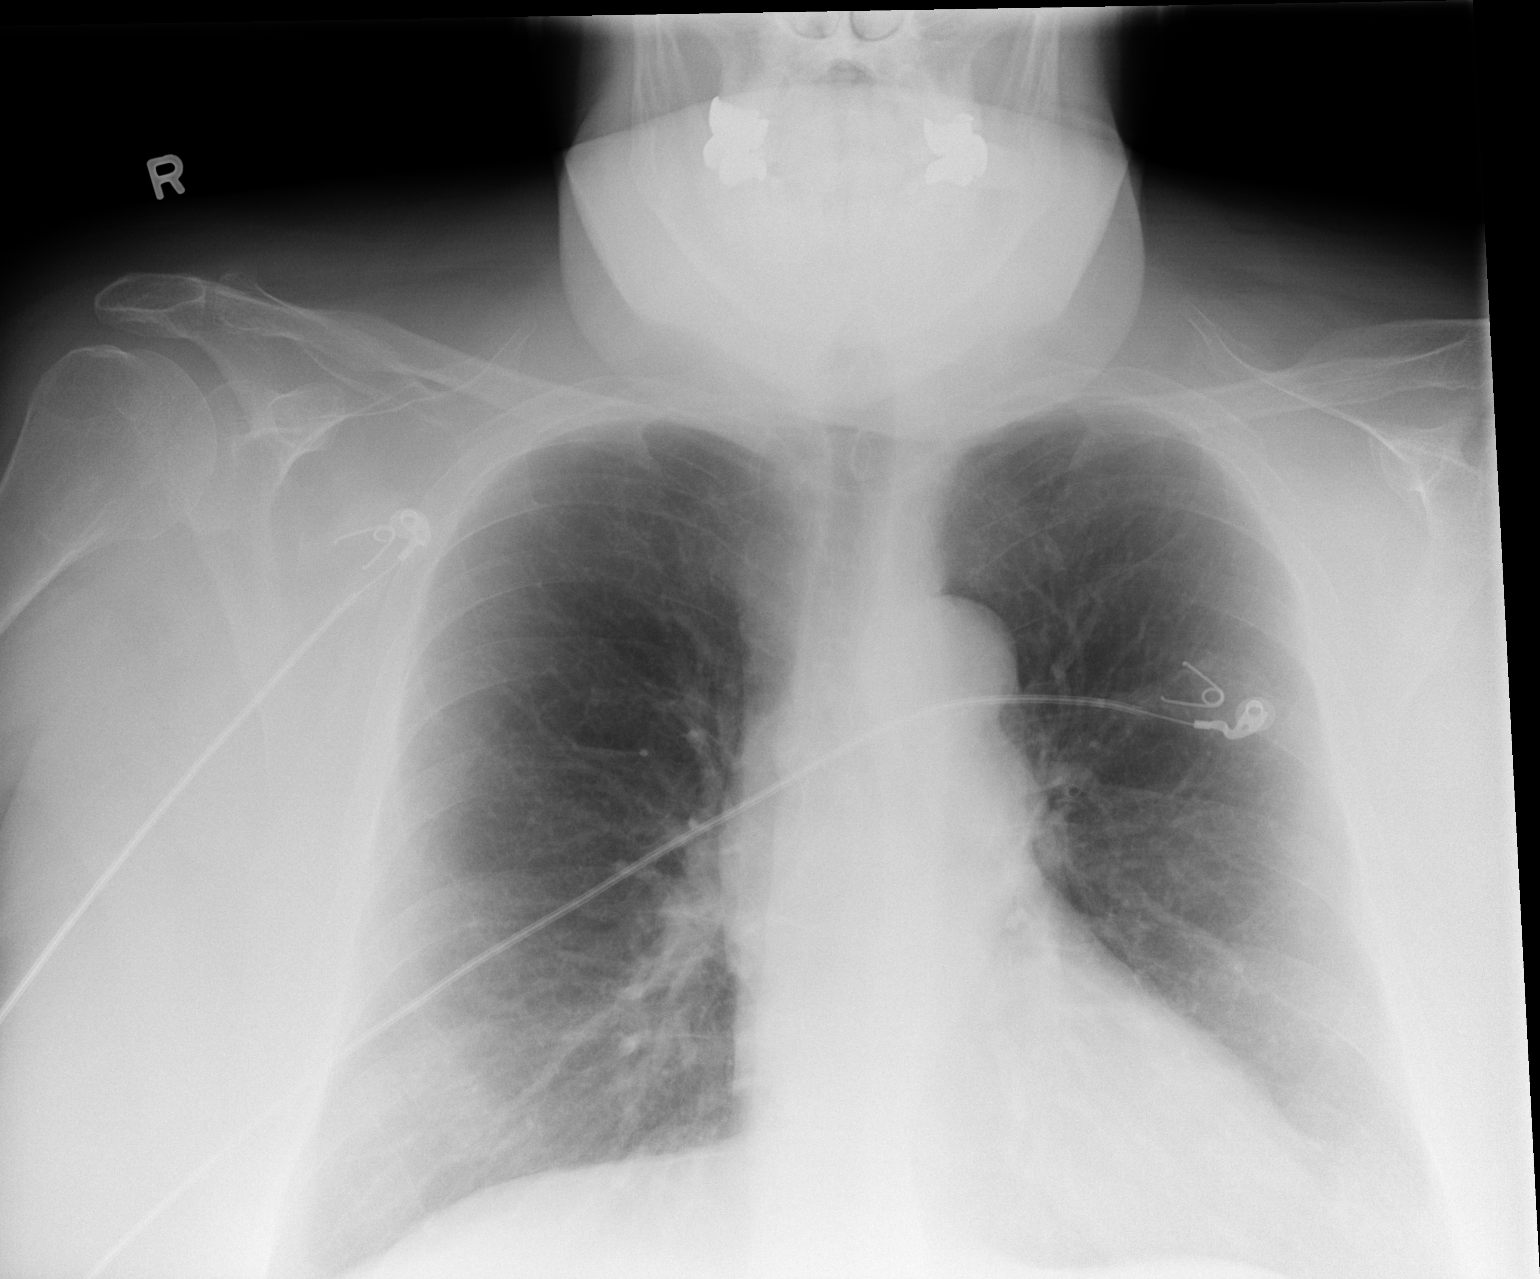
[im 2/2]
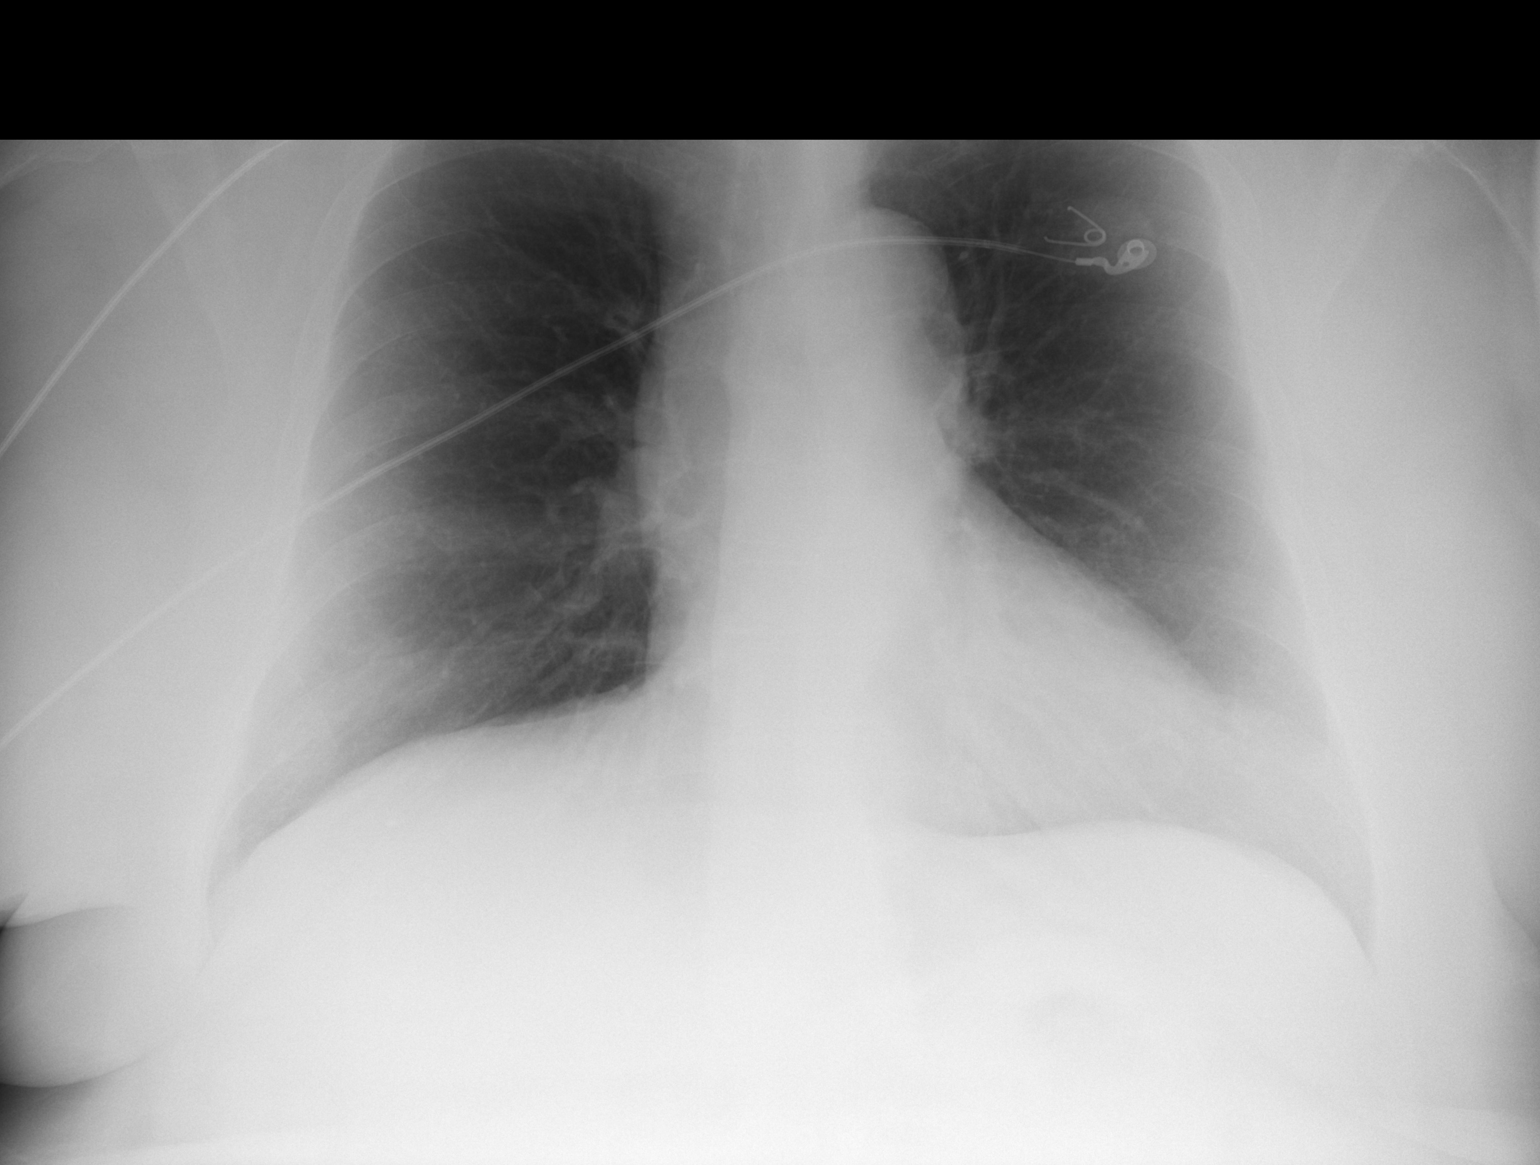

[2 of 2 positions shown; findings below may reference images not displayed]

FINDINGS: 1 view of the chest was obtained.
Lungs/pleura: Lungs are clear. No pneumothorax or pleural effusion. Pulmonary vasculature is normal.
Cardiomediastinum: Normal heart size.
Bones: No acute osseous abnormalities.
Other: None.
IMPRESSION: 
IMPRESSION: No acute cardiopulmonary process.

## 2021-09-05 IMAGING — MR MRI BRAIN WITHOUT CONTRAST
8 of 11 series · 32 of 48 positions shown · non-contrast
Comparison: Earlier today. 02/28/2021

STROKE LIKE SYMPTOMS
FINAL REPORT:
EXAM: MRI BRAIN WITHOUT CONTRAST
INDICATION: Transient ischemic attack (TIA)
tia
TECHNIQUE: Multisequence multiplanar MRI of the brain without IV contrast.

[Series 2001: survey_mpr_sag · sagittal · 1.6mm · 1.60mm/px · 1 of 5 slices shown]
[im 1/5]
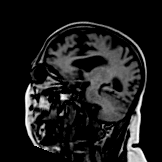

[Series 3001: survey_mpr_cor · coronal · 1.6mm · 1.60mm/px · 1 of 3 slices shown]
[im 1/3]
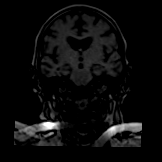

[Series 4001: survey_mpr_tra · axial · 1.6mm · 1.60mm/px · 1 of 3 slices shown]
[im 1/3]
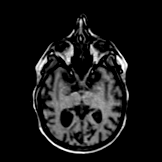

[Series 5001: T1 · sagittal · 5.0mm · 0.40mm/px · 5 of 27 slices shown (1 of 2)]
[im 1/27]
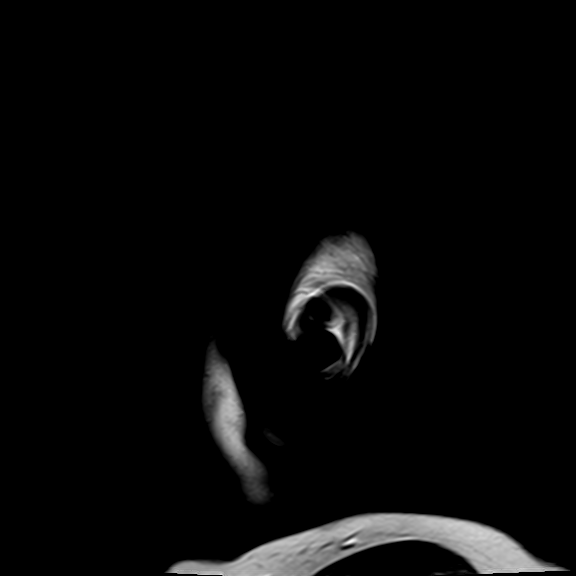
[im 7/27]
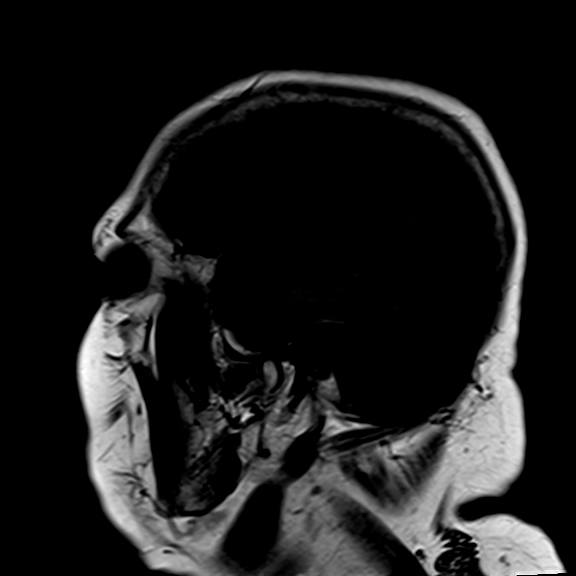
[im 14/27]
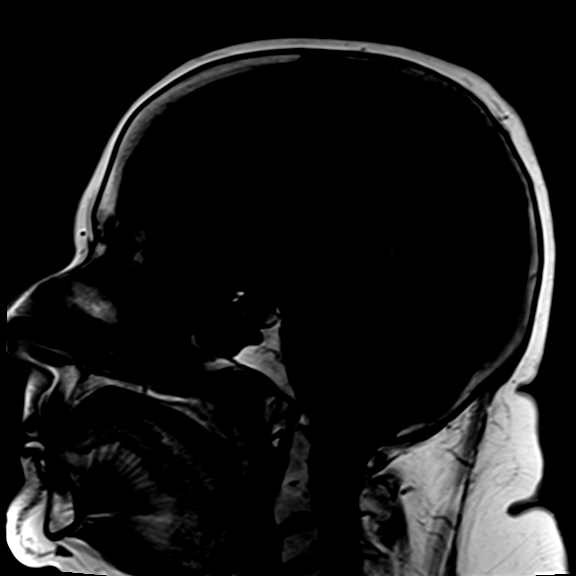
[im 20/27]
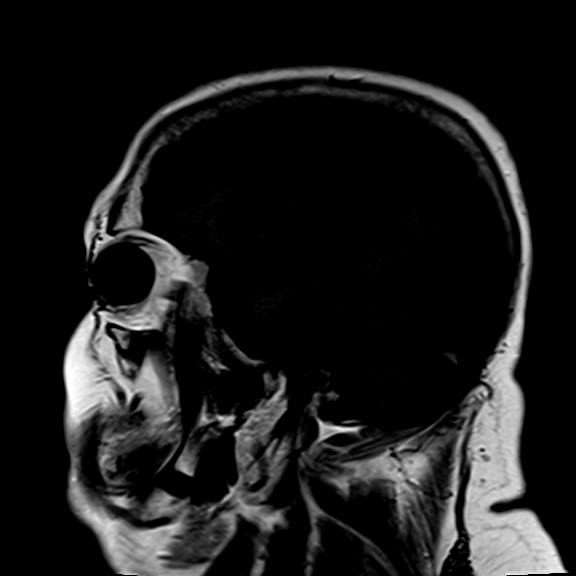
[im 27/27]
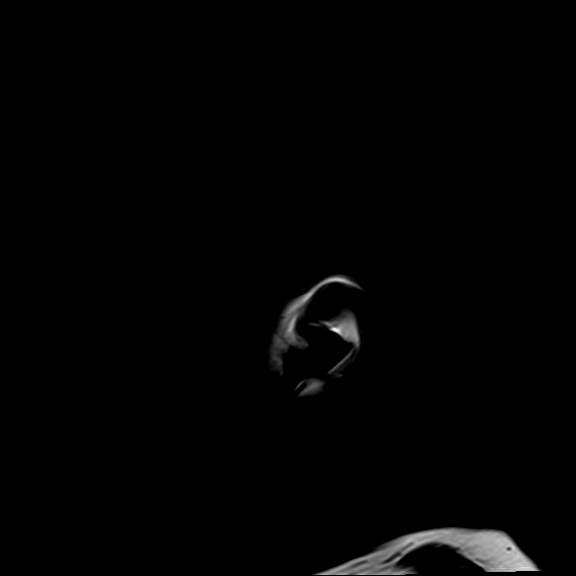

[Series 9001: T2 · axial · 5.0mm · 0.51mm/px · z∈[-90,+68]mm · 6 of 28 slices shown]
[im 1/28]
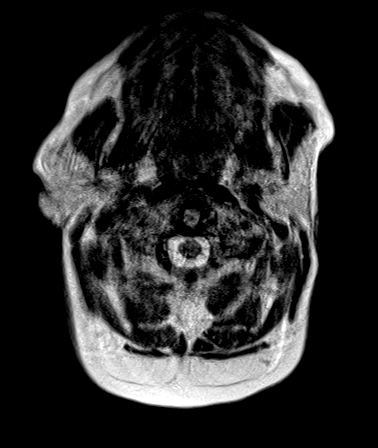
[im 6/28]
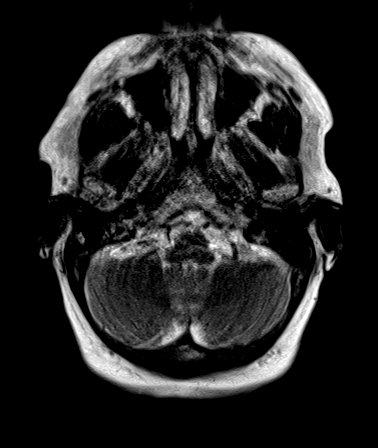
[im 11/28]
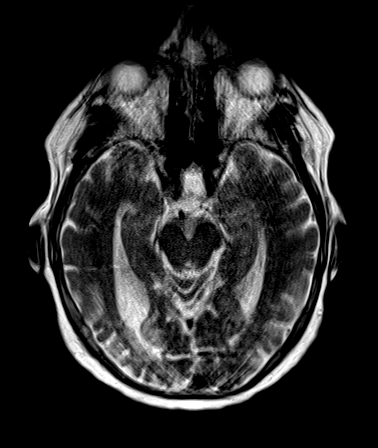
[im 17/28]
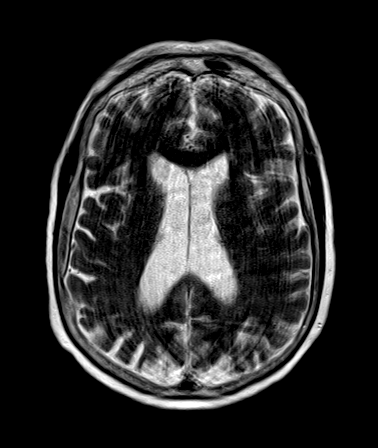
[im 22/28]
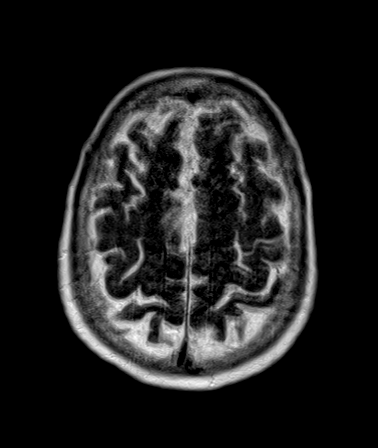
[im 28/28]
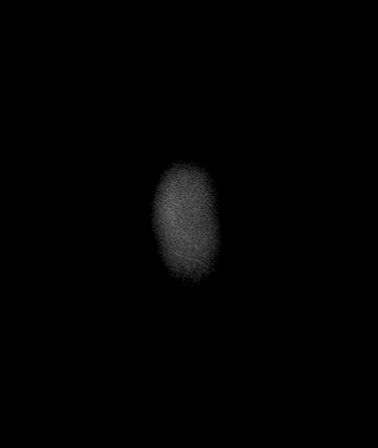

[T1 · axial · 5.0mm · 0.72mm/px · z∈[-90,+68]mm · 6 of 28 slices shown (2 of 2)]
[im 1/28]
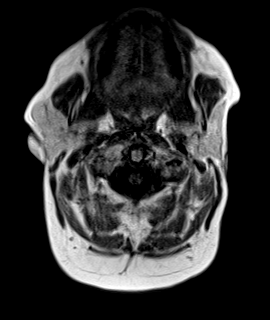
[im 6/28]
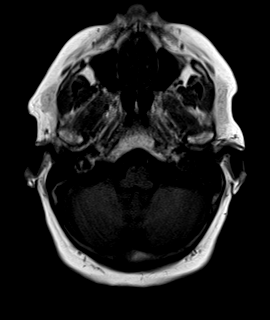
[im 11/28]
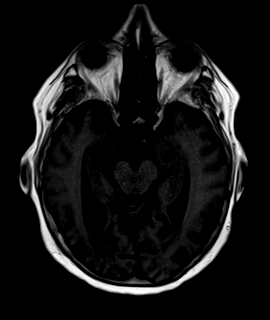
[im 17/28]
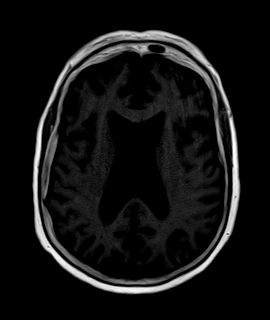
[im 22/28]
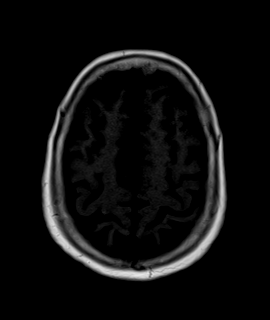
[im 28/28]
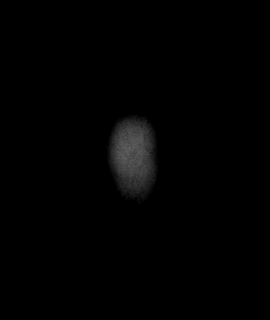

[FLAIR · axial · 5.0mm · 0.72mm/px · z∈[-90,+68]mm · 6 of 28 slices shown]
[im 1/28]
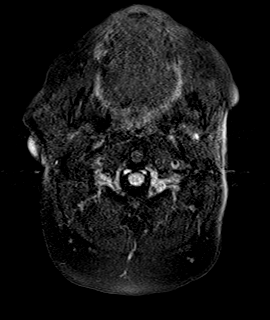
[im 6/28]
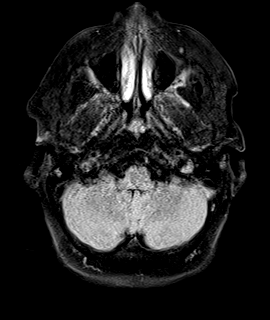
[im 11/28]
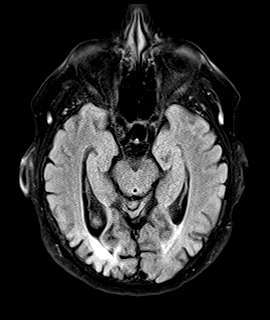
[im 17/28]
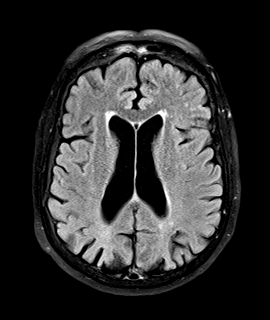
[im 22/28]
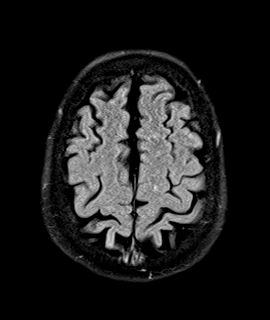
[im 28/28]
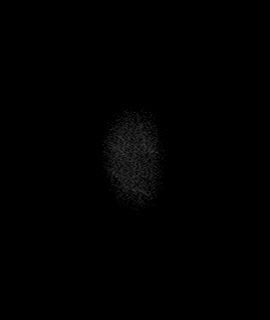

[GRE · axial · 5.0mm · 0.45mm/px · z∈[-90,+68]mm · 6 of 28 slices shown]
[im 1/28]
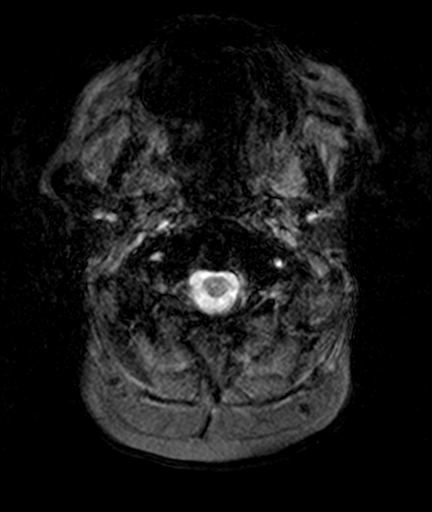
[im 6/28]
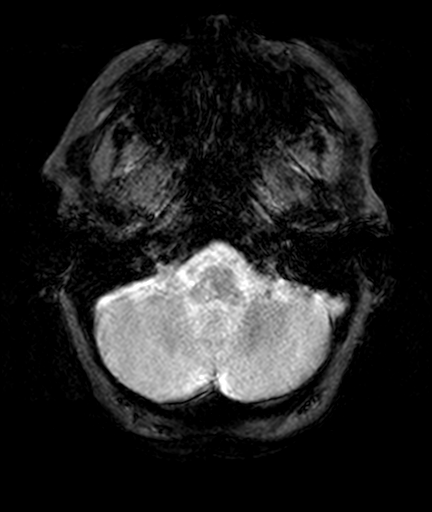
[im 11/28]
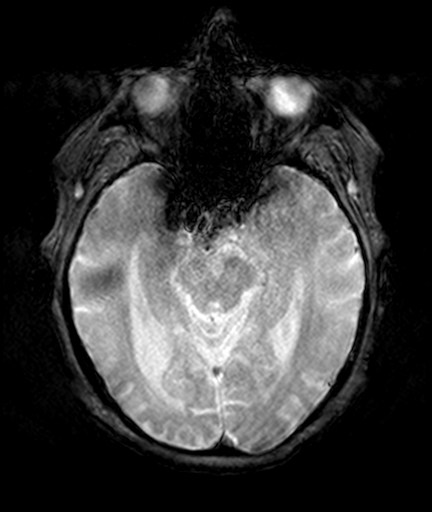
[im 17/28]
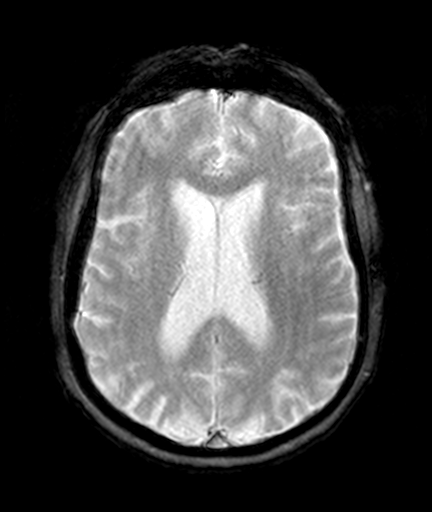
[im 22/28]
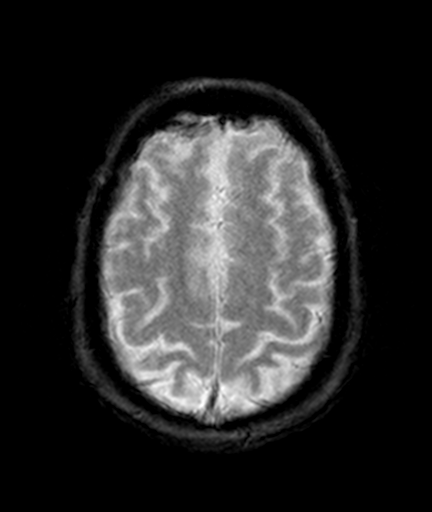
[im 28/28]
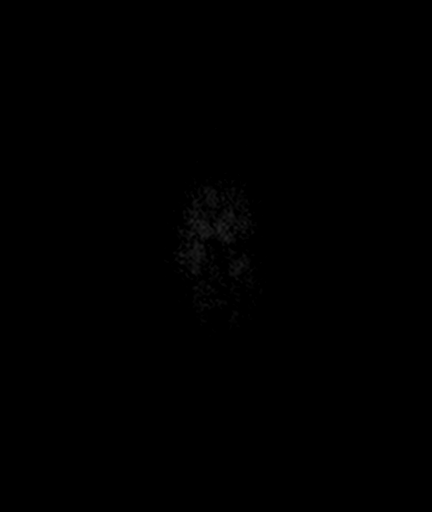

[32 of 48 positions shown; findings below may reference images not displayed]

FINDINGS: No evidence of acute infarction, intracranial hemorrhage or mass lesion.
Moderate T2/FLAIR signal abnormality within the periventricular and subcortical white matter compatible with chronic small vessel ischemic disease.
Ventricles and sulci are mildly enlarged. Mastoid air cells are clear.
Paranasal sinuses are clear.
Intracranial vascular flow voids are normal.
Status post lens extraction. Globes and orbits are otherwise unremarkable. No acute calvarial abnormalities.
IMPRESSION: 1.  No evidence of intracranial hemorrhage, acute infarction, or mass effect.
2.  Chronic small vessel ischemic disease and cerebral atrophy.

## 2022-06-17 IMAGING — DX XR CHEST 1 VIEW
1 series · 1 of 1 positions shown · non-contrast
Comparison: 09/05/2021

FINAL REPORT:
INDICATION: syncope

[AP]
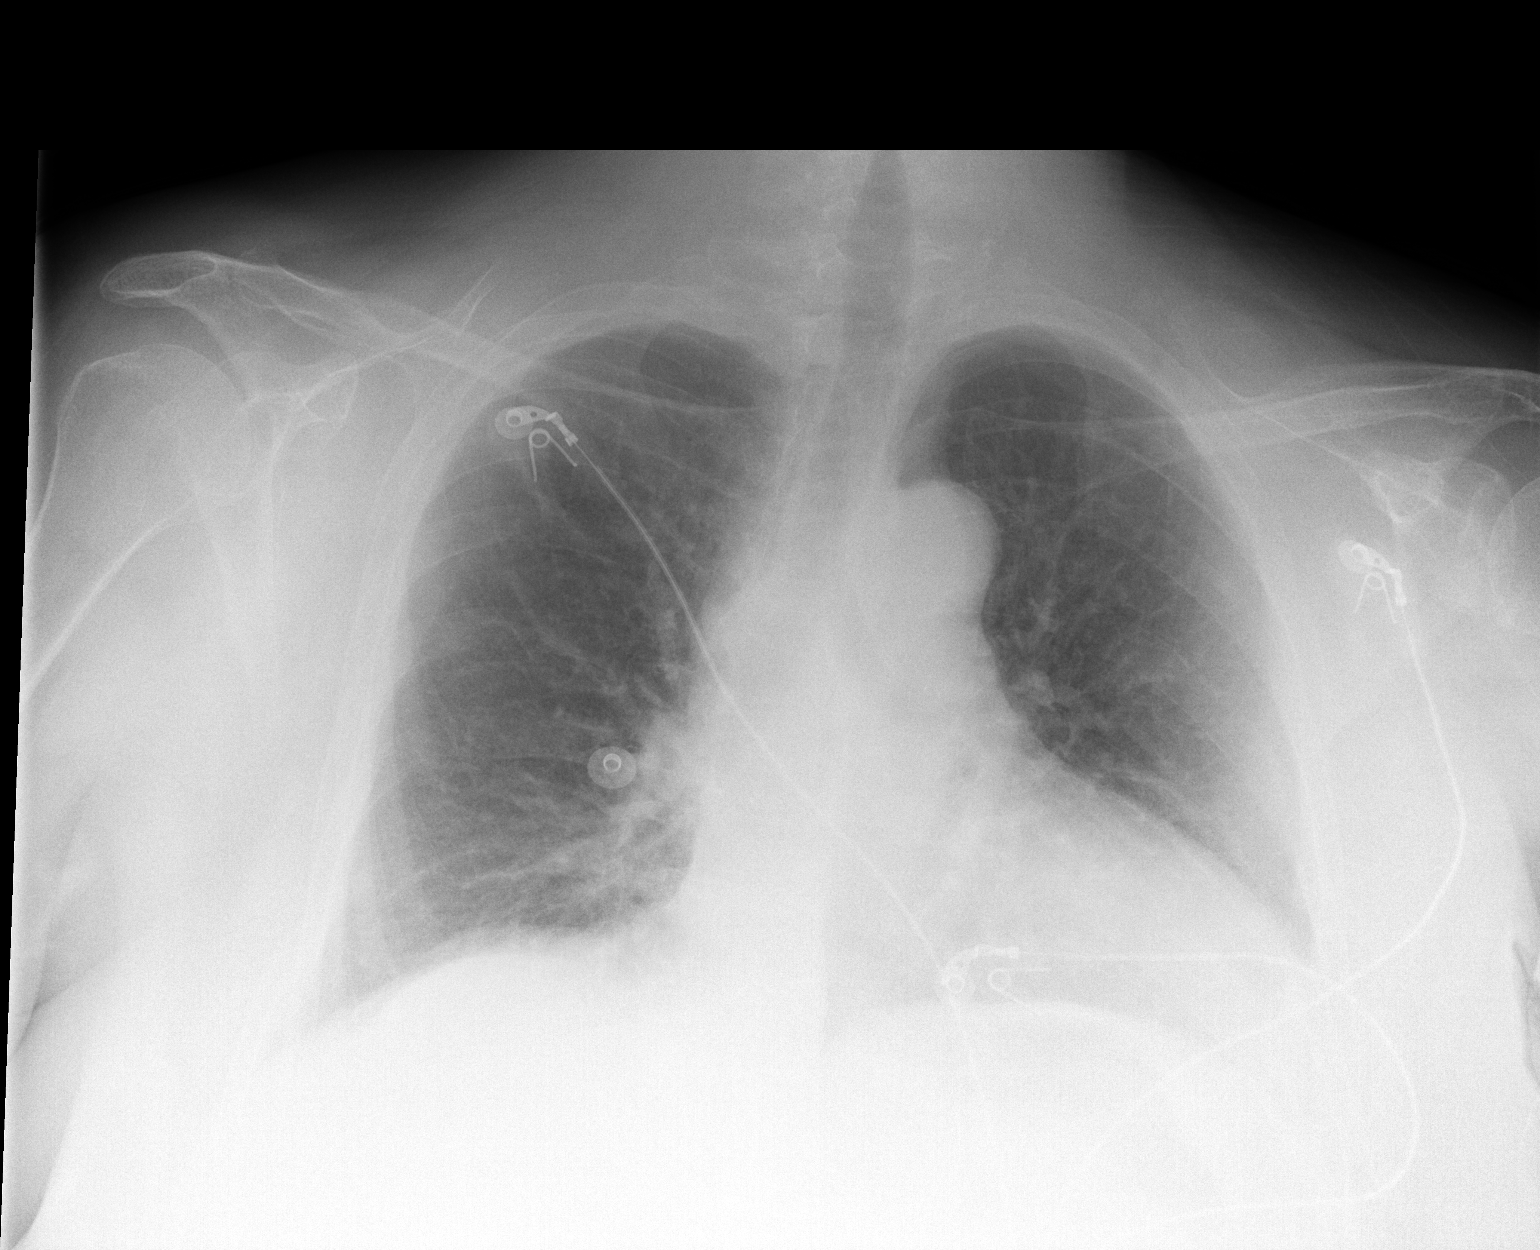

[1 of 1 positions shown; findings below may reference images not displayed]

FINDINGS: 1 view of the chest. The cardiomediastinal contours are within normal limits. No evidence of focal consolidation, pleural effusion, pulmonary edema, or pneumothorax.
IMPRESSION: 
IMPRESSION: No acute cardiopulmonary findings.

## 2022-07-12 IMAGING — MR MRI BRAIN WITH AND WITHOUT CONTRAST
15 series · 48 of 48 positions shown · IV contrast (PROHANCE)
Comparison: none

PT having memory issues
FINAL REPORT:
HISTORY: Visual disturbance. Memory loss.
MRI brain with and without contrast.
TECHNIQUE: Multiplanar-multisequence MR imaging obtained of the brain pre- and postcontrast after the uneventful administration of IV gadolinium.

[Series 2001: survey_mpr_sag · sagittal · 1.6mm · 1.60mm/px · 1 of 5 slices shown]
[im 1/5]
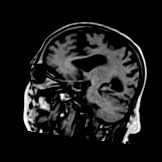

[Series 3001: survey_mpr_cor · coronal · 1.6mm · 1.60mm/px · 1 of 3 slices shown]
[im 1/3]
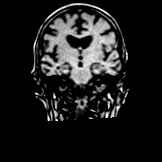

[Series 4001: survey_mpr_tra · axial · 1.6mm · 1.60mm/px · 1 of 3 slices shown]
[im 1/3]
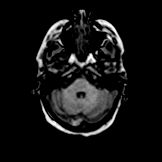

[Series 5001: T1 · sagittal · 5.0mm · 0.40mm/px · 1 of 27 slices shown (1 of 2)]
[im 1/27]
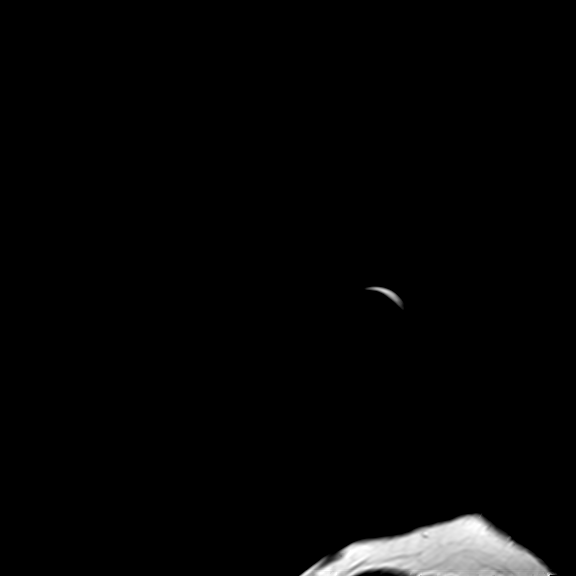

[Series 6002: ax dwi_tracew · axial · 5.0mm · 0.60mm/px · 1 of 27 slices shown]
[im 1/27]
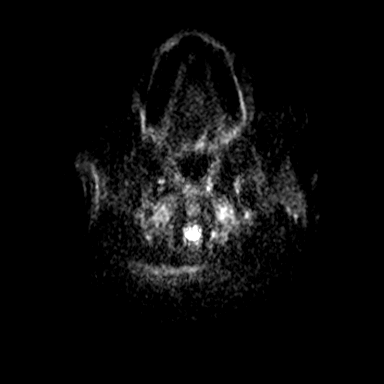

[Series 7001: ax dwi_adc · axial · 5.0mm · 0.60mm/px · 1 of 27 slices shown]
[im 1/27]
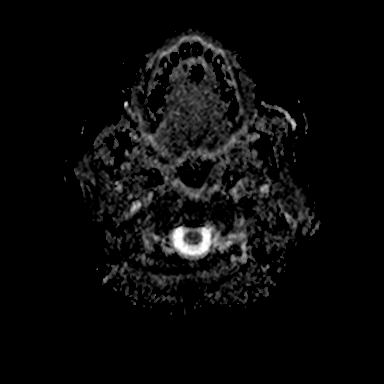

[Series 8001: ax dwi_exp · axial · 5.0mm · 0.60mm/px · 1 of 27 slices shown]
[im 1/27]
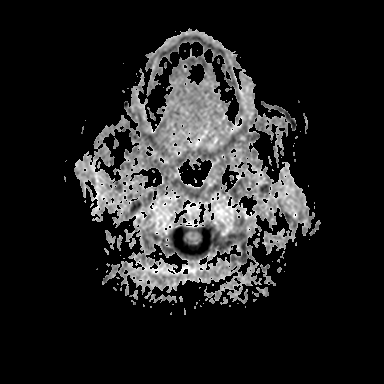

[Series 9001: T2 · axial · 5.0mm · 0.51mm/px · 1 of 27 slices shown]
[im 1/27]
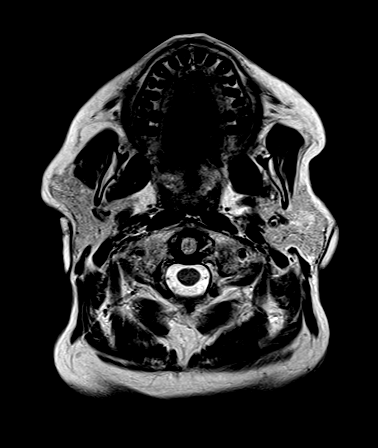

[T1 · axial · 5.0mm · 0.72mm/px · z∈[-89,+66]mm · 2 of 27 slices shown (2 of 2)]
[im 1/27]
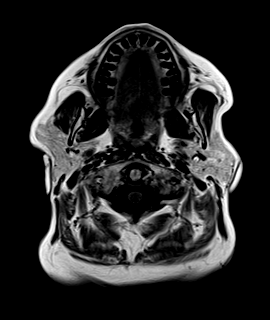
[im 27/27]
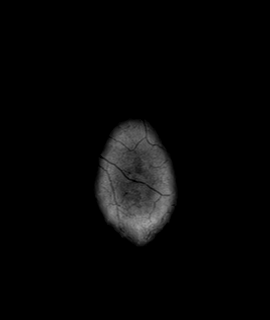

[FLAIR · axial · 5.0mm · 0.72mm/px · z∈[-89,+66]mm · 2 of 27 slices shown]
[im 1/27]
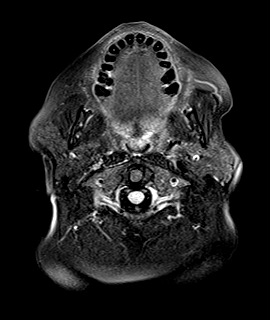
[im 27/27]
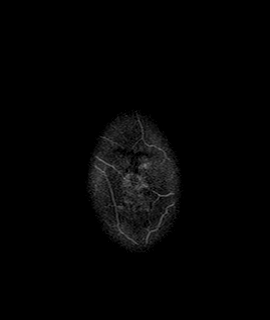

[GRE · axial · 5.0mm · 0.45mm/px · z∈[-89,+66]mm · 2 of 27 slices shown]
[im 1/27]
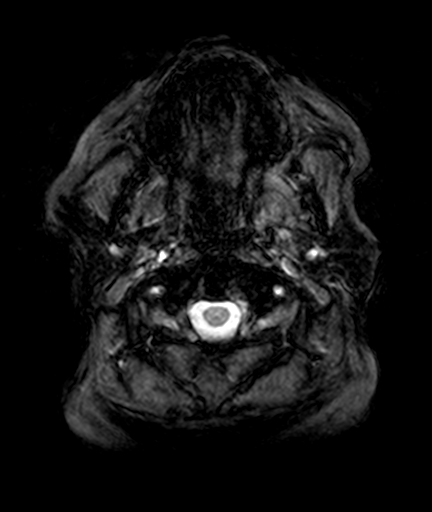
[im 27/27]
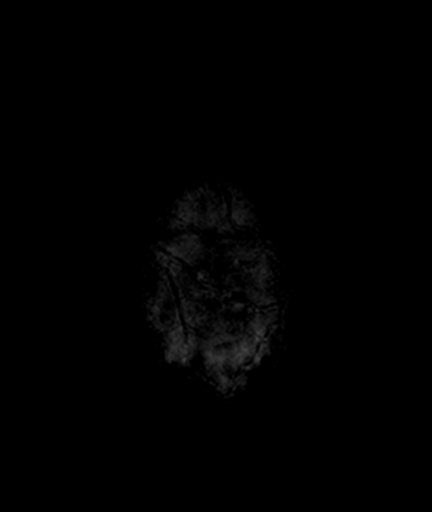

[T1 post-contrast · axial · 5.0mm · 0.72mm/px · z∈[-89,+66]mm · 2 of 27 slices shown]
[im 1/27]
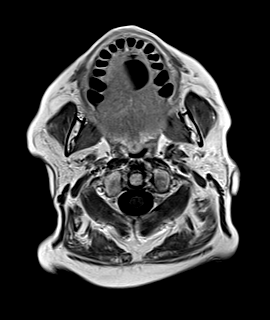
[im 27/27]
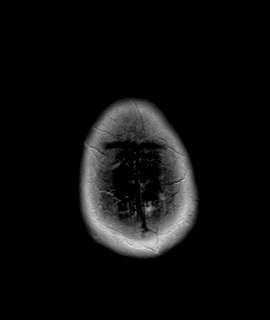

[t1_mprage_tra_p2_iso_1.0 post · axial · 1.0mm · 0.97mm/px · z∈[-96,+63]mm · 10 of 160 slices shown]
[im 1/160]
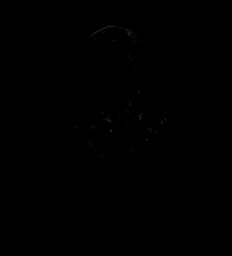
[im 18/160]
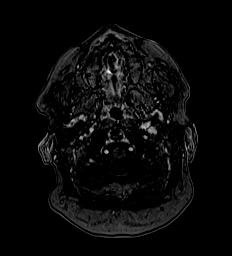
[im 36/160]
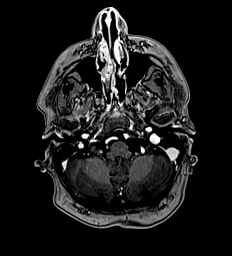
[im 54/160]
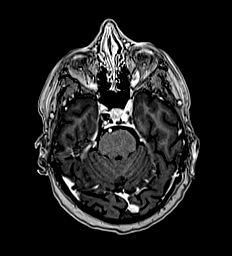
[im 71/160]
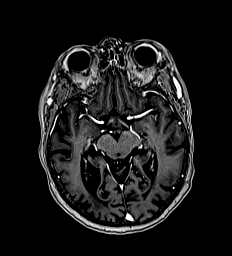
[im 89/160]
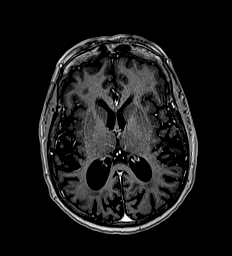
[im 107/160]
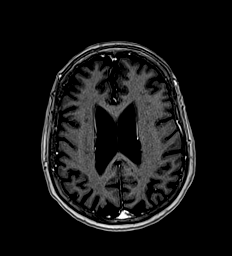
[im 124/160]
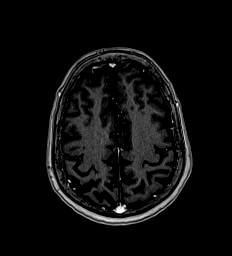
[im 142/160]
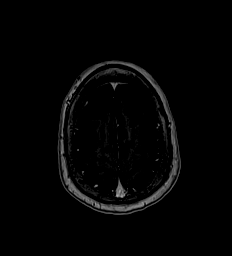
[im 160/160]
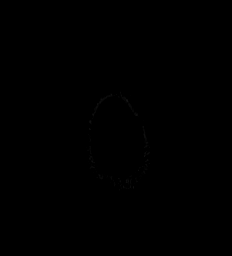

[t1_mprage_tra_p2_iso_1.0 post_mpr_sag · sagittal · 1.0mm · 0.97mm/px · 9 of 150 slices shown]
[im 1/150]
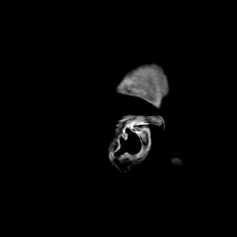
[im 19/150]
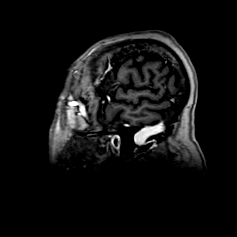
[im 38/150]
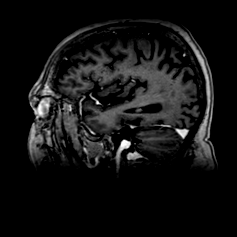
[im 56/150]
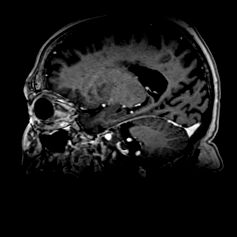
[im 75/150]
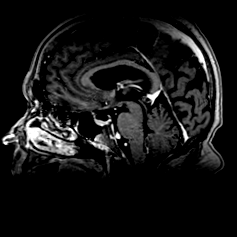
[im 94/150]
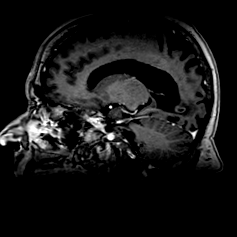
[im 112/150]
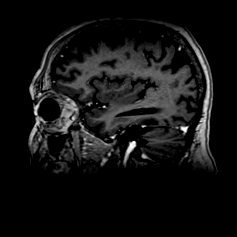
[im 131/150]
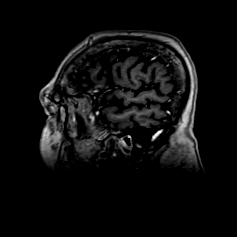
[im 150/150]
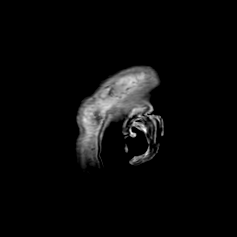

[t1_mprage_tra_p2_iso_1.0 post_mpr_cor · coronal · 1.0mm · 0.97mm/px · 13 of 200 slices shown]
[im 1/200]
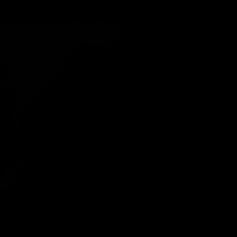
[im 17/200]
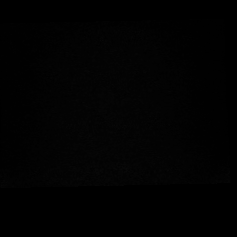
[im 34/200]
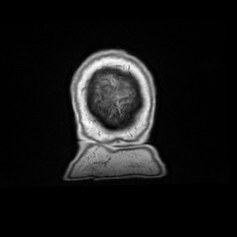
[im 50/200]
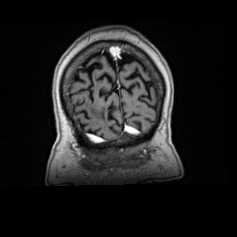
[im 67/200]
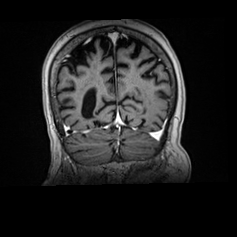
[im 83/200]
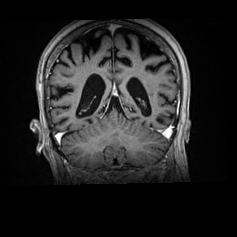
[im 100/200]
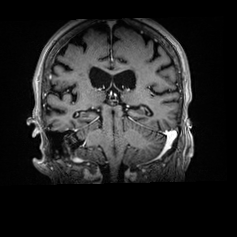
[im 117/200]
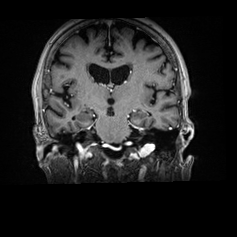
[im 133/200]
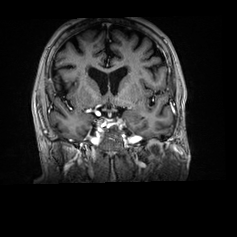
[im 150/200]
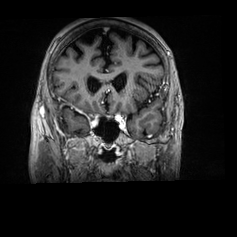
[im 166/200]
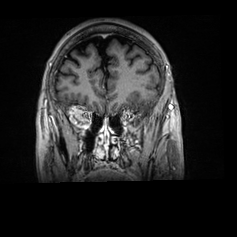
[im 183/200]
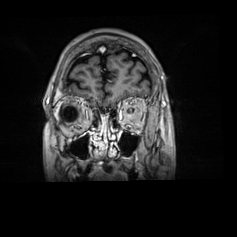
[im 200/200]
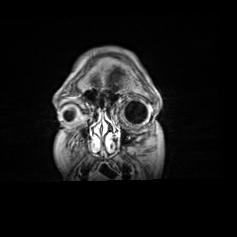

[48 of 48 positions shown; findings below may reference images not displayed]

FINDINGS: No restricted diffusion or associated ADC mapping defect to suggest acute or early subacute infarction. Mild to moderate intracranial atrophy. Mild to moderate periventricular foci of T2 prolongation consistent with chronic microvascular ischemic change. No cerebral or cerebellar mass or mass effect, midline shift, or extra-axial fluid collection.
No hydrocephalus. The larger intracranial flow voids are visualized. The basilar cisterns and aqueducts are maintained. The midline and sellar structures are preserved. Bone marrow signal intensity is maintained in the skull base and calvarium.
The orbits, paranasal sinuses, and mastoids are clear.
Postcontrast imaging demonstrates no abnormal enhancing parenchymal, leptomeningeal, or dural lesions.
IMPRESSION: 1. Mild to moderate intracranial atrophy.
2. Mild to moderate chronic periventricular microvascular ischemic change.
3. No acute intracranial abnormality.

## 2023-12-10 DEATH — deceased
